# Patient Record
Sex: Female | Born: 1955
Health system: Southern US, Community
[De-identification: ages and names within clinical notes are randomized; demographics above are authoritative.]

## PROBLEM LIST (undated history)

## (undated) DIAGNOSIS — Z87442 Personal history of urinary calculi: Secondary | ICD-10-CM

## (undated) DIAGNOSIS — K219 Gastro-esophageal reflux disease without esophagitis: Secondary | ICD-10-CM

## (undated) DIAGNOSIS — M1990 Unspecified osteoarthritis, unspecified site: Secondary | ICD-10-CM

## (undated) DIAGNOSIS — E785 Hyperlipidemia, unspecified: Secondary | ICD-10-CM

## (undated) DIAGNOSIS — I10 Essential (primary) hypertension: Secondary | ICD-10-CM

## (undated) DIAGNOSIS — T7840XA Allergy, unspecified, initial encounter: Secondary | ICD-10-CM

## (undated) DIAGNOSIS — E039 Hypothyroidism, unspecified: Secondary | ICD-10-CM

## (undated) HISTORY — PX: OTHER SURGICAL HISTORY: SHX169

## (undated) HISTORY — DX: Allergy, unspecified, initial encounter: T78.40XA

## (undated) HISTORY — PX: TONSILLECTOMY: SUR1361

## (undated) HISTORY — DX: Essential (primary) hypertension: I10

## (undated) HISTORY — PX: TONSILLECTOMY AND ADENOIDECTOMY: SHX28

## (undated) HISTORY — DX: Gastro-esophageal reflux disease without esophagitis: K21.9

## (undated) HISTORY — PX: BUNIONECTOMY: SHX129

## (undated) HISTORY — PX: DILATION AND CURETTAGE OF UTERUS: SHX78

## (undated) HISTORY — DX: Hyperlipidemia, unspecified: E78.5

---

## 1997-10-12 ENCOUNTER — Ambulatory Visit (HOSPITAL_COMMUNITY): Admission: RE | Admit: 1997-10-12 | Discharge: 1997-10-12 | Payer: Self-pay | Admitting: *Deleted

## 1998-02-05 ENCOUNTER — Other Ambulatory Visit: Admission: RE | Admit: 1998-02-05 | Discharge: 1998-02-05 | Payer: Self-pay | Admitting: *Deleted

## 1998-06-19 HISTORY — PX: ABDOMINAL HYSTERECTOMY: SHX81

## 1998-09-16 ENCOUNTER — Inpatient Hospital Stay (HOSPITAL_COMMUNITY): Admission: RE | Admit: 1998-09-16 | Discharge: 1998-09-18 | Payer: Self-pay | Admitting: *Deleted

## 1998-10-12 ENCOUNTER — Encounter: Payer: Self-pay | Admitting: *Deleted

## 1998-10-12 ENCOUNTER — Ambulatory Visit (HOSPITAL_COMMUNITY): Admission: RE | Admit: 1998-10-12 | Discharge: 1998-10-12 | Payer: Self-pay | Admitting: *Deleted

## 1999-09-28 ENCOUNTER — Other Ambulatory Visit: Admission: RE | Admit: 1999-09-28 | Discharge: 1999-09-28 | Payer: Self-pay | Admitting: *Deleted

## 1999-10-13 ENCOUNTER — Encounter: Payer: Self-pay | Admitting: *Deleted

## 1999-10-13 ENCOUNTER — Ambulatory Visit (HOSPITAL_COMMUNITY): Admission: RE | Admit: 1999-10-13 | Discharge: 1999-10-13 | Payer: Self-pay | Admitting: *Deleted

## 2000-10-03 ENCOUNTER — Other Ambulatory Visit: Admission: RE | Admit: 2000-10-03 | Discharge: 2000-10-03 | Payer: Self-pay | Admitting: *Deleted

## 2000-10-19 ENCOUNTER — Ambulatory Visit (HOSPITAL_COMMUNITY): Admission: RE | Admit: 2000-10-19 | Discharge: 2000-10-19 | Payer: Self-pay | Admitting: *Deleted

## 2000-10-19 ENCOUNTER — Encounter: Payer: Self-pay | Admitting: *Deleted

## 2000-11-15 ENCOUNTER — Ambulatory Visit (HOSPITAL_COMMUNITY): Admission: RE | Admit: 2000-11-15 | Discharge: 2000-11-15 | Payer: Self-pay | Admitting: Gastroenterology

## 2000-11-15 ENCOUNTER — Encounter: Payer: Self-pay | Admitting: Gastroenterology

## 2001-10-01 ENCOUNTER — Encounter: Payer: Self-pay | Admitting: Family Medicine

## 2001-10-01 ENCOUNTER — Encounter: Admission: RE | Admit: 2001-10-01 | Discharge: 2001-10-01 | Payer: Self-pay | Admitting: Family Medicine

## 2001-10-03 ENCOUNTER — Other Ambulatory Visit: Admission: RE | Admit: 2001-10-03 | Discharge: 2001-10-03 | Payer: Self-pay | Admitting: *Deleted

## 2001-11-12 ENCOUNTER — Ambulatory Visit (HOSPITAL_COMMUNITY): Admission: RE | Admit: 2001-11-12 | Discharge: 2001-11-12 | Payer: Self-pay | Admitting: *Deleted

## 2001-11-12 ENCOUNTER — Encounter: Payer: Self-pay | Admitting: *Deleted

## 2002-03-25 ENCOUNTER — Ambulatory Visit (HOSPITAL_COMMUNITY): Admission: RE | Admit: 2002-03-25 | Discharge: 2002-03-25 | Payer: Self-pay | Admitting: Gastroenterology

## 2002-03-25 ENCOUNTER — Encounter: Payer: Self-pay | Admitting: Gastroenterology

## 2002-11-14 ENCOUNTER — Ambulatory Visit (HOSPITAL_COMMUNITY): Admission: RE | Admit: 2002-11-14 | Discharge: 2002-11-14 | Payer: Self-pay | Admitting: *Deleted

## 2002-11-14 ENCOUNTER — Encounter: Payer: Self-pay | Admitting: *Deleted

## 2002-11-18 ENCOUNTER — Other Ambulatory Visit: Admission: RE | Admit: 2002-11-18 | Discharge: 2002-11-18 | Payer: Self-pay | Admitting: *Deleted

## 2003-11-17 ENCOUNTER — Ambulatory Visit (HOSPITAL_COMMUNITY): Admission: RE | Admit: 2003-11-17 | Discharge: 2003-11-17 | Payer: Self-pay | Admitting: *Deleted

## 2003-11-18 ENCOUNTER — Other Ambulatory Visit: Admission: RE | Admit: 2003-11-18 | Discharge: 2003-11-18 | Payer: Self-pay | Admitting: *Deleted

## 2004-10-17 ENCOUNTER — Encounter: Admission: RE | Admit: 2004-10-17 | Discharge: 2004-10-17 | Payer: Self-pay | Admitting: Gastroenterology

## 2004-11-17 ENCOUNTER — Ambulatory Visit (HOSPITAL_COMMUNITY): Admission: RE | Admit: 2004-11-17 | Discharge: 2004-11-17 | Payer: Self-pay | Admitting: *Deleted

## 2004-11-29 ENCOUNTER — Encounter: Admission: RE | Admit: 2004-11-29 | Discharge: 2004-11-29 | Payer: Self-pay | Admitting: *Deleted

## 2004-12-01 ENCOUNTER — Other Ambulatory Visit: Admission: RE | Admit: 2004-12-01 | Discharge: 2004-12-01 | Payer: Self-pay | Admitting: *Deleted

## 2005-06-07 ENCOUNTER — Encounter: Admission: RE | Admit: 2005-06-07 | Discharge: 2005-06-07 | Payer: Self-pay | Admitting: Gastroenterology

## 2005-06-19 HISTORY — PX: CHOLECYSTECTOMY: SHX55

## 2005-07-20 ENCOUNTER — Ambulatory Visit (HOSPITAL_COMMUNITY): Admission: RE | Admit: 2005-07-20 | Discharge: 2005-07-20 | Payer: Self-pay | Admitting: Surgery

## 2005-07-20 ENCOUNTER — Encounter (INDEPENDENT_AMBULATORY_CARE_PROVIDER_SITE_OTHER): Payer: Self-pay | Admitting: Specialist

## 2005-11-20 ENCOUNTER — Ambulatory Visit (HOSPITAL_COMMUNITY): Admission: RE | Admit: 2005-11-20 | Discharge: 2005-11-20 | Payer: Self-pay | Admitting: *Deleted

## 2005-12-27 ENCOUNTER — Other Ambulatory Visit: Admission: RE | Admit: 2005-12-27 | Discharge: 2005-12-27 | Payer: Self-pay | Admitting: *Deleted

## 2006-03-06 ENCOUNTER — Encounter: Admission: RE | Admit: 2006-03-06 | Discharge: 2006-03-06 | Payer: Self-pay | Admitting: Gastroenterology

## 2006-10-12 ENCOUNTER — Ambulatory Visit (HOSPITAL_COMMUNITY): Admission: RE | Admit: 2006-10-12 | Discharge: 2006-10-12 | Payer: Self-pay | Admitting: Gastroenterology

## 2006-11-23 ENCOUNTER — Encounter: Admission: RE | Admit: 2006-11-23 | Discharge: 2006-11-23 | Payer: Self-pay | Admitting: *Deleted

## 2007-01-08 ENCOUNTER — Encounter: Admission: RE | Admit: 2007-01-08 | Discharge: 2007-01-08 | Payer: Self-pay | Admitting: Family Medicine

## 2007-01-16 ENCOUNTER — Other Ambulatory Visit: Admission: RE | Admit: 2007-01-16 | Discharge: 2007-01-16 | Payer: Self-pay | Admitting: *Deleted

## 2007-10-29 ENCOUNTER — Ambulatory Visit (HOSPITAL_BASED_OUTPATIENT_CLINIC_OR_DEPARTMENT_OTHER): Admission: RE | Admit: 2007-10-29 | Discharge: 2007-10-29 | Payer: Self-pay | Admitting: Otolaryngology

## 2007-11-12 ENCOUNTER — Ambulatory Visit: Payer: Self-pay | Admitting: Internal Medicine

## 2007-11-26 ENCOUNTER — Ambulatory Visit (HOSPITAL_COMMUNITY): Admission: RE | Admit: 2007-11-26 | Discharge: 2007-11-26 | Payer: Self-pay | Admitting: Obstetrics and Gynecology

## 2008-10-16 ENCOUNTER — Encounter: Admission: RE | Admit: 2008-10-16 | Discharge: 2008-10-16 | Payer: Self-pay | Admitting: Otolaryngology

## 2008-11-27 ENCOUNTER — Ambulatory Visit (HOSPITAL_COMMUNITY): Admission: RE | Admit: 2008-11-27 | Discharge: 2008-11-27 | Payer: Self-pay | Admitting: Obstetrics and Gynecology

## 2009-06-15 ENCOUNTER — Ambulatory Visit (HOSPITAL_BASED_OUTPATIENT_CLINIC_OR_DEPARTMENT_OTHER): Admission: RE | Admit: 2009-06-15 | Discharge: 2009-06-15 | Payer: Self-pay | Admitting: Otolaryngology

## 2009-12-08 ENCOUNTER — Encounter: Admission: RE | Admit: 2009-12-08 | Discharge: 2009-12-08 | Payer: Self-pay | Admitting: Obstetrics and Gynecology

## 2010-07-10 ENCOUNTER — Encounter: Payer: Self-pay | Admitting: Obstetrics and Gynecology

## 2010-07-10 ENCOUNTER — Encounter: Payer: Self-pay | Admitting: *Deleted

## 2010-07-10 ENCOUNTER — Encounter: Payer: Self-pay | Admitting: Gastroenterology

## 2010-09-19 LAB — POCT HEMOGLOBIN-HEMACUE: Hemoglobin: 13.5 g/dL (ref 12.0–15.0)

## 2010-09-20 ENCOUNTER — Other Ambulatory Visit: Payer: Self-pay | Admitting: Gynecology

## 2010-09-20 DIAGNOSIS — Z1231 Encounter for screening mammogram for malignant neoplasm of breast: Secondary | ICD-10-CM

## 2010-11-01 NOTE — Procedures (Signed)
NAME:  Nancy Lee, Nancy Lee               ACCOUNT NO.:  [id number]   MEDICAL RECORD NO.:  [id number]          PATIENT TYPE:  OUT   LOCATION:  SLEEP CENTER                 FACILITY:  [facility]   PHYSICIAN:  Clinton D. Maple Hudson, MD, FCCP, FACPDATE OF BIRTH:  04-26-1956   DATE OF STUDY:  10/29/2007                            NOCTURNAL POLYSOMNOGRAM   REFERRING PHYSICIAN:  Antony Contras, MD   INDICATIONS FOR PROCEDURE:  Insomnia with sleep apnea.   RESULTS:  Epward sleepiness score 0/24; BMI 26, weight 171 pounds,  height 68 inches, neck 14.5 inches.   HOME MEDICATIONS:  Charted and reviewed.   SLEEP ARCHITECTURE:  Total sleep time 354 minutes with sleep efficiency  86.9%. Stage 1 was 8.9%; Stage 2, 68.3%; Stage 3 absent. REM 22.8% of  total sleep time. Sleep latency 36 minutes, REM latency 87 minutes.  Awake after sleep onset 18.5 minutes. Arousal index 10.2. No bedtime  medication was taken.   RESPIRATORY DATA:  Apnea/hypopnea index (AHI) 3 per hour, which is  normal. 18 events were scored including 5 obstructive apnea's and 13  hypopnea's. Events were not positional. REM AHI 11.9. There were  insufficient events to permit CPAP titration by split protocol on this  study night.   OXYGEN DATA:  Moderate snoring with oxygen desaturation to a nadir of  84%. Mean oxygen saturation through the study was 97.5% on room air.   CARDIAC DATA:  Normal sinus rhythm.   MOVEMENT/PARASOMNIA:  No movement disturbance. No bathroom trips.   IMPRESSION/RECOMMENDATIONS:  1. Unremarkable sleep architecture for sleep center environment.  2. Occasional respiratory events, AHI 3 per hour (normal range 0 to 5      per hour) with moderate snoring and oxygen desaturation to a nadir      of 84%.  3. No specific intervention is suggested by the results of this test.     Clinton D. Maple Hudson, MD, [facility], FACP  Diplomate, Biomedical engineer of Sleep Medicine  Electronically Signed    CDY/MEDQ  D:  11/09/2007 19:50:27  T:   11/09/2007 22:04:01  Job:  [id number]

## 2010-11-04 NOTE — Op Note (Signed)
NAME:  Nancy Lee, Nancy Lee                         ACCOUNT NO.:  [id number]   MEDICAL RECORD NO.:  [id number]                   PATIENT TYPE:  AMB   LOCATION:  ENDO                                 FACILITY:  [facility]   PHYSICIAN:  Petra Kuba, M.D.                 DATE OF BIRTH:  Oct 22, 1955   DATE OF PROCEDURE:  DATE OF DISCHARGE:                                 OPERATIVE REPORT   PROCEDURE:  Esophagogastroduodenoscopy with Savary dilatation.   INDICATIONS FOR PROCEDURE:  A patient with a history of dysphagia helped  with dilatation in the past requesting repeat more aggressive dilatation.   Consent was signed after risks, benefits, methods, and options were  thoroughly discussed multiple times in the past.   MEDICINES USED:  Demerol 60, Versed 7.   DESCRIPTION OF PROCEDURE:  The video endoscope was inserted by direct  vision. The esophagus was normal. In the distal esophagus possibly a small  thin ring was seen. The scope passed easily through this into a normal  stomach, advanced through a normal antrum, normal pylorus into a normal  duodenal bulb and around the C loop to a normal second portion of the  duodenum. The scope was withdrawn back to the bulb and a good look there  ruled out ulcers in that location. The scope was withdrawn back to the  stomach and retroflexed. The angularis, cardia, fundus, lesser and greater  and curve were normal on retroflexed visualization. Straight visualization  of the stomach was normal. The scope was then withdrawn back to 20 cm. No  additional esophageal findings were seen. The scope was then readvanced to  the antrum, the Savary wire was advanced, scope was removed, proper position  of the wire was confirmed under Fluoro. In succession, the Savary 16 and  then 17 mm dilators were both advanced under Fluoro guidance into the  stomach. There was minimal resistance on both dilators, minimal heme on the  17 only but not on the 16. Once the 17 was  withdrawn, it was advanced into  the stomach, the wire was withdrawn back into the dilator and both were  removed in tandem. The procedure terminated at that juncture. The patient  tolerated the procedure well. There was on obvious or immediate  complication.   ENDOSCOPIC DIAGNOSIS:  1. Questionable distal esophageal thin ring.  2. Otherwise within normal limits esophagogastroduodenoscopy therapy, Savary     dilatation under Fluoro to 17 mm.   PLAN:  Continue pump inhibitors, follow-up p.r.n. or in two months to  recheck symptoms and make sure repeat barium swallow, manometry or other  workup and plans are not needed. Have her call me sooner p.r.n.  Petra Kuba, M.D.    MEM/MEDQ  D:  03/25/2002  T:  03/25/2002  Job:  [id number]   cc:   Gretta Arab. Valentina Lucks, M.D.

## 2010-11-04 NOTE — Op Note (Signed)
Nancy Lee, Nancy Lee               ACCOUNT NO.:  [id number]   MEDICAL RECORD NO.:  [id number]          PATIENT TYPE:  AMB   LOCATION:  ENDO                         FACILITY:  [facility]   PHYSICIAN:  Petra Kuba, M.D.    DATE OF BIRTH:  [birth year]   DATE OF PROCEDURE:  10/12/2006  DATE OF DISCHARGE:                               OPERATIVE REPORT   SURGEON:  Petra Kuba, M.D.   PROCEDURE:  Esophagogastroduodenoscopy with Savary dilation.   INDICATIONS:  Patient with history of recurrent dysphagia, helped with  dilation in the past.  Consent was signed after risks, benefits, methods  and options were thoroughly discussed multiple times in the past.   MEDICINES USED:  Fentanyl 75 mcg, Versed 7.5 mg.   DESCRIPTION OF PROCEDURE:  The video endoscope was inserted by direct  vision.  The esophagus was normal.  Maybe she had a tiny hiatal hernia.  No obvious ring or stricture were seen.  The scope passed into the  stomach and advanced through a normal antrum, normal pylorus into a  normal duodenal bulb, and around the C loop to a normal second portion  of the duodenum.  The scope was slowly withdrawn back to the bulb.  A  good look there ruled out abnormalities in that location.  The scope was  withdrawn back to the stomach and retroflexed.  The cardia, fundus,  angularis, lesser and greater curve were normal on retroflex  visualization.  Straight visualization of the stomach did not reveal any  additional findings.  The air was suctioned and the scope slowly  withdrawn.  Again, a good look at the esophagus was normal.  The scope  was withdrawn back to about 18 cm.  We then went ahead and readvanced to  the antrum.  The customary J loop of the scope was confirmed under  fluoro.  The Savary wire was advanced.  The J loop of the wire was  confirmed.  Once the wire was in the proper position in the stomach  under fluoro and endoscopic visualization, the scope was removed, making  sure to  keep the wire in the proper position, and we went ahead and  dilated her just once with the 16-mm dilator over the wire under fluoro  in the customary fashion.  There was minimal proximal resistance only.  Once the dilator was confirmed in the stomach, the wire was withdrawn  back into the dilator and both were removed in tandem.  The procedure  was terminated a this juncture.  The patient tolerated the procedure  well.  There was no obvious immediate complication.   ENDOSCOPIC DIAGNOSES:  1. Questionable tiny hiatal hernia.  2. Otherwise normal esophagogastroduodenoscopy.   THERAPY:  Savary dilation under fluoro to 16 mm only, with minimal  proximal resistance and no heme.   PLAN:  She wants to try Aciphex as she had not tried that, and we will  give her some samples in the office.  We will see how the dilation  works.  Call me p.r.n., and otherwise follow up in 2 to 3 months to  recheck symptoms and making sure no further workup plans are needed.           ______________________________  Petra Kuba, M.D.     MEM/MEDQ  D:  10/12/2006  T:  10/12/2006  Job:  [id number]   cc:   Gretta Arab. Valentina Lucks, M.D.

## 2010-11-04 NOTE — Op Note (Signed)
NAME:  Nancy Lee, Nancy Lee               ACCOUNT NO.:  [id number]   MEDICAL RECORD NO.:  [id number]          PATIENT TYPE:  AMB   LOCATION:  DAY                          [facility]ACILITY:  [facility]   PHYSICIAN:  Thomas A. Cornett, M.D.DATE OF BIRTH:  1956/01/30   DATE OF PROCEDURE:  07/20/2005  DATE OF DISCHARGE:                                 OPERATIVE REPORT   PREOPERATIVE DIAGNOSIS:  Symptomatic cholelithiasis.   POSTOPERATIVE DIAGNOSIS:  Symptomatic cholelithiasis.   PROCEDURE:  Laparoscopic cholecystectomy with intraoperative cholangiogram.   SURGEON:  Dr. Harriette Bouillon.   ASSISTANT:  Dr. Ollen Gross. Carolynne Edouard.   ANESTHESIA:  General endotracheal anesthesia with 20 mL of 0.25% Sensorcaine  local.   ESTIMATED BLOOD LOSS:  30 mL.   DRAINS:  None.   SPECIMEN:  Gallbladder with gallstones to pathology.   INDICATIONS FOR PROCEDURE:  The patient is a 55 year old female with  progressive right upper quadrant pain with meals. This has become quite  severe and workup revealed gallstones on workup. Her symptoms were  consistent with symptomatic biliary colic as well as her ultrasound findings  of gallstones. After discussion of treatment options, I recommended  laparoscopic cholecystectomy for symptomatic cholelithiasis. Informed  consent was obtained, the patient agreed to proceed.   DESCRIPTION OF PROCEDURE:  The patient was brought to the operating room,  placed supine. After induction of general endotracheal anesthesia, her  abdomen was prepped and draped in a sterile fashion. An oral gastric tube  was placed and the patient received preoperative antibiotics. A 1 cm  supraumbilical incision was made, dissection was carried to her fascia and  her fascia was grasped with a Kocher clamp. An incision was made in the  fascia and a second Kocher clamp was used to hold the fascia open. I  extended the fascial incision and placed a Kelly clamp through the  preperitoneal space on opening the peritoneal  cavity. I placed my finger and  swept around and felt no intra-abdominal adhesions. A pursestring suture of  #0 Vicryl was placed and a 12-mm Hassan cannula was placed under direct  vision. Pneumoperitoneum was created to 15 mmHg with CO2 and laparoscopy was  performed. There was no evidence of any solid or hollow organ injury with  insertion of the Hasson. Next a 5-mm subxiphoid port was placed under direct  vision. Two other 5-mm ports were placed in the right mid abdomen, both  under direct vision with local anesthesia. There were some right upper  quadrant adhesions what appeared to be a tract from the right lobe of the  liver to the abdominal wall consistent with a previous percutaneous drain of  some sort. We took these adhesions down to the right lobe of the liver as  well as some omental adhesions in the right upper quadrant to facilitate  retraction of the gallbladder over the liver edge. Next the dome was grasped  and retracted toward the patient's right shoulder. There were some omental  adhesions that had pulled away to expose the body and infundibulum of the  gallbladder. Once we did this, we were able to grasp  the infundibulum. I had  to mobilize the gallbladder from its peritoneal attachments to facilitate  more range of motion of the infundibulum. There was a small capsule injury  where the gallbladder was retracted from the right lobe of the liver. At  this point, I was able to clear off the cystic duct and dissect  circumferentially around the cystic duct. This was the only tubular  structure entering the gallbladder. A clip was placed on the gallbladder  side of this and a small incision was made in the cystic duct. A Cook  cholangiogram catheter was introduced and held in place by a clip.  Intraoperative cholangiogram using fluoroscopy and one-half strength Hypaque  dye was used. There is a very long cystic duct draining into common bile  duct. There was a very long  cystic duct draining into a common bile duct.  There was flow of contrast up into the common hepatic duct and right and  left hepatic ducts and free flow of contrast down the common bile duct into  the duodenum without stricture or filling defect of note. Once this was  complete, the cholangiogram catheter was removed and the cystic duct stump  was triple clipped and divided. At this point, the cystic artery was  dissected out circumferentially and it was double clipped and divided.  Cautery was used to dissect the gallbladder from the gallbladder fossa.  There were no other tubular structures encountered at this point. Once the  gallbladder was dissected out circumferentially from the gallbladder fossa,  I exchanged the 10 mm camera for the 5 mm camera and placed the EndoCatch  bag through the umbilical port to extract the gallbladder and did so. I then  replaced the 10 mm scope at the umbilicus and reexamined the gallbladder  bed. There was some oozing which was controlled by electrocautery.  Hemostasis at this point in time after irrigation was excellent. All  remaining fluid was suctioned out until clear. There was one small hole made  in the gallbladder during the extraction process prior to placing it in a  bag but no stones were spilled. At this point in time, I extracted all the  ports under direct vision with no signs of port site bleeding. I then  withdrew the camera, withdrew the [facility] port and released all CO2. The  umbilical fascia was closed with the previously placed #0 Vicryl suture and  4-0 Monocryl was used to close all skin incisions. Steri-Strips and dry  dressings were applied. All final counts of sponge, needle and instruments  were found to be correct. The patient was awoke and taken to recovery in  satisfactory condition.      Thomas A. Cornett, M.D.  Electronically Signed    TAC/MEDQ  D:  07/20/2005  T:  07/20/2005  Job:  [id number]   cc:   Petra Kuba,  M.D.  Fax: [phone]

## 2010-12-16 ENCOUNTER — Other Ambulatory Visit: Payer: Self-pay | Admitting: Gynecology

## 2010-12-16 ENCOUNTER — Ambulatory Visit
Admission: RE | Admit: 2010-12-16 | Discharge: 2010-12-16 | Disposition: A | Discharge status: Home / Self Care | Payer: 59 | Source: Ambulatory Visit | Attending: Gynecology | Admitting: Gynecology

## 2010-12-16 DIAGNOSIS — Z1231 Encounter for screening mammogram for malignant neoplasm of breast: Secondary | ICD-10-CM

## 2011-01-16 ENCOUNTER — Other Ambulatory Visit: Payer: Self-pay | Admitting: Family Medicine

## 2011-01-16 DIAGNOSIS — E049 Nontoxic goiter, unspecified: Secondary | ICD-10-CM

## 2011-01-20 ENCOUNTER — Ambulatory Visit
Admission: RE | Admit: 2011-01-20 | Discharge: 2011-01-20 | Disposition: A | Discharge status: Home / Self Care | Payer: 59 | Source: Ambulatory Visit | Attending: Family Medicine | Admitting: Family Medicine

## 2011-01-20 DIAGNOSIS — E049 Nontoxic goiter, unspecified: Secondary | ICD-10-CM

## 2011-01-23 ENCOUNTER — Other Ambulatory Visit: Payer: Self-pay | Admitting: Family Medicine

## 2011-01-23 DIAGNOSIS — E042 Nontoxic multinodular goiter: Secondary | ICD-10-CM

## 2011-01-25 ENCOUNTER — Other Ambulatory Visit (HOSPITAL_COMMUNITY)
Admission: RE | Admit: 2011-01-25 | Discharge: 2011-01-25 | Disposition: A | Discharge status: Home / Self Care | Payer: 59 | Source: Ambulatory Visit | Attending: Interventional Radiology | Admitting: Interventional Radiology

## 2011-01-25 ENCOUNTER — Ambulatory Visit
Admission: RE | Admit: 2011-01-25 | Discharge: 2011-01-25 | Disposition: A | Discharge status: Home / Self Care | Payer: 59 | Source: Ambulatory Visit | Attending: Family Medicine | Admitting: Family Medicine

## 2011-01-25 DIAGNOSIS — E042 Nontoxic multinodular goiter: Secondary | ICD-10-CM

## 2011-01-25 DIAGNOSIS — E049 Nontoxic goiter, unspecified: Secondary | ICD-10-CM | POA: Insufficient documentation

## 2011-02-22 ENCOUNTER — Encounter (INDEPENDENT_AMBULATORY_CARE_PROVIDER_SITE_OTHER): Payer: Self-pay | Admitting: General Surgery

## 2011-02-23 ENCOUNTER — Ambulatory Visit (INDEPENDENT_AMBULATORY_CARE_PROVIDER_SITE_OTHER): Payer: 59 | Admitting: Surgery

## 2011-02-23 ENCOUNTER — Encounter (INDEPENDENT_AMBULATORY_CARE_PROVIDER_SITE_OTHER): Payer: Self-pay | Admitting: Surgery

## 2011-02-23 VITALS — BP 144/90 | HR 82 | Temp 98.2°F | Resp 12

## 2011-02-23 DIAGNOSIS — E042 Nontoxic multinodular goiter: Secondary | ICD-10-CM

## 2011-02-23 NOTE — Patient Instructions (Signed)
You will be scheduled for surgery.

## 2011-02-23 NOTE — Progress Notes (Signed)
Chief Complaint  Patient presents with  . Thyroid Nodule    HPI Nancy Lee is a 55 y.o. female.   HPI The patient presents at the request of Dr. Maurice Small due to multiple thyroid nodules. This is picked up on physical exam. She is to 3 cm nodules involving her left lobe and multiple 1 cm nodule of her right lobe of the thyroid. FNA was done of the 2 left thyroid lobe lesions. These came back as follicular lesions and oncocytic lesions.  She denies any pain in her neck, hoarseness, difficulty swallowing or difficulty breathing.  Past Medical History  Diagnosis Date  . Allergy   . GERD (gastroesophageal reflux disease)   . Hyperlipidemia   . Hypertension     Past Surgical History  Procedure Date  . Abdominal hysterectomy   . Cholecystectomy   . Bunionectomy   . Dilation and curette   . Tonsillectomy and adenoidectomy     Family History  Problem Relation Age of Onset  . Hypertension Mother   . Alcohol abuse Father   . Coronary artery disease      Social History History  Substance Use Topics  . Smoking status: Former Games developer  . Smokeless tobacco: Not on file  . Alcohol Use: 1.2 oz/week    2 Glasses of wine per week    Allergies  Allergen Reactions  . Penicillins Rash    Current Outpatient Prescriptions  Medication Sig Dispense Refill  . aspirin 81 MG tablet Take 81 mg by mouth daily.        . Calcium Carbonate (CALCIUM 600) 1500 MG TABS Take 1 tablet by mouth 1 day or 1 dose.        . calcium citrate-vitamin D (CITRACAL+D) 315-200 MG-UNIT per tablet Take 1 tablet by mouth daily.        . cholecalciferol (VITAMIN D) 1000 UNITS tablet Take 1,000 Units by mouth daily.        Marland Kitchen Cod Liver Oil CAPS Take by mouth 1 day or 1 dose.        . fexofenadine (ALLEGRA) 180 MG tablet Take 180 mg by mouth daily.        . Flaxseed, Linseed, (FLAX SEED OIL) 1000 MG CAPS Take 1,000 mg by mouth 1 day or 1 dose.        . fluticasone (FLONASE) 50 MCG/ACT nasal spray Place 1  spray into the nose 2 (two) times daily. 1 PUFF IN EACH NOSTRIL       . Multiple Vitamin (MULTIVITAMIN) capsule Take 1 capsule by mouth daily.        Marland Kitchen OMEPRAZOLE PO Take 40 mg by mouth 1 day or 1 dose. DELAYED RELEASE          Review of Systems Review of Systems  Constitutional: Negative.   HENT: Negative.   Eyes: Negative.   Respiratory: Negative.   Cardiovascular: Negative.   Gastrointestinal: Negative.   Genitourinary: Negative.   Musculoskeletal: Negative.   Neurological: Negative.   Hematological: Negative.   Psychiatric/Behavioral: Negative.     Blood pressure 144/90, pulse 82, temperature 98.2 F (36.8 C), resp. rate 12.  Physical Exam Physical Exam  Constitutional: She is oriented to person, place, and time. She appears well-developed and well-nourished.  HENT:  Head: Normocephalic and atraumatic.  Nose: Nose normal.  Eyes: Conjunctivae and EOM are normal. Pupils are equal, round, and reactive to light.  Neck: Normal range of motion. Neck supple. No tracheal deviation present. Thyromegaly present.  Thyroid goiter.  Two 3 cm left thyroid lobe lesions and  One 1 cm thyroid lobe lesion noted.   Cardiovascular: Normal rate, regular rhythm and normal heart sounds.   Pulmonary/Chest: Effort normal and breath sounds normal.  Lymphadenopathy:    She has no cervical adenopathy.  Neurological: She is alert and oriented to person, place, and time.  Skin: Skin is warm and dry.  Psychiatric: She has a normal mood and affect. Her behavior is normal. Judgment and thought content normal.    Data Reviewed Thyroid ultrasound reveals 2 dominant left thyroid lobe lesions measuring 3 cm each in maximal diameter. She has a 1 centimeter lesion in the right thyroid lobe.  Biopsy of left dominant nodules shows a follicular lesion and oncocytic lesion. No atypia  Assessment    Multiple bilateral thyroid nodules 2 of which are 3 cm follicular lesions without atypia.    Plan      I discussed observation versus surgical treatment of this problem. Given the size of her thyroid gland and  multiple  nodules she will probably  require total thyroidectomy. I've also discussed observation with close followup with ultrasound versus hemithyroidectomy for the 2 left sided lesions only. She will pursue surgical therapy. She would like to proceed with total thyroidectomy. Risks of bleeding, infection, low calcium, airway obstruction,  nerve damage to vocal cords,  damage to other structures discussed.  She will need lifelong thyroid replacement.  She agrees to proceed.       CORNETT,THOMAS A. 02/23/2011, 4:01 PM

## 2011-04-24 ENCOUNTER — Other Ambulatory Visit (HOSPITAL_COMMUNITY): Payer: 59

## 2011-04-25 ENCOUNTER — Encounter (HOSPITAL_COMMUNITY): Payer: Self-pay | Admitting: Pharmacy Technician

## 2011-04-26 ENCOUNTER — Encounter (HOSPITAL_COMMUNITY)
Admission: RE | Admit: 2011-04-26 | Discharge: 2011-04-26 | Disposition: A | Discharge status: Home / Self Care | Payer: 59 | Source: Ambulatory Visit | Attending: Surgery | Admitting: Surgery

## 2011-04-26 ENCOUNTER — Encounter (HOSPITAL_COMMUNITY): Payer: Self-pay

## 2011-04-26 ENCOUNTER — Other Ambulatory Visit: Payer: Self-pay

## 2011-04-26 LAB — CBC
HCT: 38.5 % (ref 36.0–46.0)
Hemoglobin: 13.2 g/dL (ref 12.0–15.0)
MCH: 30.3 pg (ref 26.0–34.0)
MCHC: 34.3 g/dL (ref 30.0–36.0)
MCV: 88.5 fL (ref 78.0–100.0)
Platelets: 195 10*3/uL (ref 150–400)
RBC: 4.35 MIL/uL (ref 3.87–5.11)
RDW: 12.6 % (ref 11.5–15.5)
WBC: 7 10*3/uL (ref 4.0–10.5)

## 2011-04-26 LAB — COMPREHENSIVE METABOLIC PANEL
ALT: 14 U/L (ref 0–35)
AST: 15 U/L (ref 0–37)
Albumin: 4.2 g/dL (ref 3.5–5.2)
Alkaline Phosphatase: 116 U/L (ref 39–117)
BUN: 17 mg/dL (ref 6–23)
CO2: 29 mEq/L (ref 19–32)
Calcium: 10.1 mg/dL (ref 8.4–10.5)
Chloride: 103 mEq/L (ref 96–112)
Creatinine, Ser: 0.85 mg/dL (ref 0.50–1.10)
GFR calc Af Amer: 88 mL/min — ABNORMAL LOW (ref >90)
GFR calc non Af Amer: 76 mL/min — ABNORMAL LOW (ref >90)
Glucose, Bld: 90 mg/dL (ref 70–99)
Potassium: 3.9 mEq/L (ref 3.5–5.1)
Sodium: 140 mEq/L (ref 135–145)
Total Bilirubin: 0.2 mg/dL — ABNORMAL LOW (ref 0.3–1.2)
Total Protein: 7.6 g/dL (ref 6.0–8.3)

## 2011-04-26 LAB — SURGICAL PCR SCREEN
MRSA, PCR: NEGATIVE
Staphylococcus aureus: NEGATIVE

## 2011-04-26 LAB — DIFFERENTIAL
Basophils Absolute: 0 10*3/uL (ref 0.0–0.1)
Basophils Relative: 0 % (ref 0–1)
Eosinophils Absolute: 0.1 10*3/uL (ref 0.0–0.7)
Eosinophils Relative: 2 % (ref 0–5)
Lymphocytes Relative: 42 % (ref 12–46)
Lymphs Abs: 2.9 10*3/uL (ref 0.7–4.0)
Monocytes Absolute: 0.5 10*3/uL (ref 0.1–1.0)
Monocytes Relative: 8 % (ref 3–12)
Neutro Abs: 3.5 10*3/uL (ref 1.7–7.7)
Neutrophils Relative %: 49 % (ref 43–77)

## 2011-04-26 NOTE — Pre-Procedure Instructions (Signed)
20 Nancy Lee  04/26/2011   Your procedure is scheduled on:  Tuesday, Nov 13  Report to [facility] at 5:30 AM.  Call this number if you have problems the morning of surgery: [phone]   Remember:   Do not eat food:After Midnight.  Do not drink clear liquids: 4 Hours before arrival.  Take these medicines the morning of surgery with A SIP OF WATER: Omeprazole   Do not wear jewelry, make-up or nail polish.  Do not wear lotions, powders, or perfumes. You may wear deodorant.  Do not shave 48 hours prior to surgery.  Do not bring valuables to the hospital.  Contacts, dentures or bridgework may not be worn into surgery.  Leave suitcase in the car. After surgery it may be brought to your room.  For patients admitted to the hospital, checkout time is 11:00 AM the day of discharge.   Patients discharged the day of surgery will not be allowed to drive home.  Name and phone number of your driver: n/a  Special Instructions: CHG Shower Use Special Wash: 1/2 bottle night before surgery and 1/2 bottle morning of surgery.   Please read over the following fact sheets that you were given: Pain Booklet, Coughing and Deep Breathing, MRSA Information and Surgical Site Infection Prevention

## 2011-05-01 ENCOUNTER — Encounter (HOSPITAL_COMMUNITY): Payer: Self-pay | Admitting: Surgery

## 2011-05-01 MED ORDER — CEFAZOLIN SODIUM 1-5 GM-% IV SOLN
1.0000 g | INTRAVENOUS | Status: AC
  Administered 2011-05-02: 1 g via INTRAVENOUS
  Filled 2011-05-01: qty 50

## 2011-05-01 NOTE — H&P (Signed)
LACRESHA FUSILIER   02/23/2011 3:00 PM Office Visit  MRN: [id number]   Description: 55 year old female  Provider: Dortha Schwalbe., MD  Department: [facility]        Diagnoses     Multiple thyroid nodules   - Primary    241.1      Reason for Visit     Thyroid Nodule        Vitals - Last Recorded       BP Pulse Temp Resp    144/90  82  98.2 F (36.8 C)  12       Progress Notes     CORNETT,THOMAS A., MD  02/23/2011  4:16 PM  SignedChief Complaint   Patient presents with   .  Thyroid Nodule      HPI Shannelle Alguire Sawka is a 55 y.o. female.   HPI The patient presents at the request of Dr. Maurice Small due to multiple thyroid nodules. This is picked up on physical exam. She is to 3 cm nodules involving her left lobe and multiple 1 cm nodule of her right lobe of the thyroid. FNA was done of the 2 left thyroid lobe lesions. These came back as follicular lesions and oncocytic lesions.  She denies any pain in her neck, hoarseness, difficulty swallowing or difficulty breathing.    Past Medical History   Diagnosis  Date   .  Allergy     .  GERD (gastroesophageal reflux disease)     .  Hyperlipidemia     .  Hypertension         Past Surgical History   Procedure  Date   .  Abdominal hysterectomy     .  Cholecystectomy     .  Bunionectomy     .  Dilation and curette     .  Tonsillectomy and adenoidectomy         Family History   Problem  Relation  Age of Onset   .  Hypertension  Mother     .  Alcohol abuse  Father     .  Coronary artery disease          Social History History   Substance Use Topics   .  Smoking status:  Former Games developer   .  Smokeless tobacco:  Not on file   .  Alcohol Use:  1.2 oz/week       2 Glasses of wine per week       Allergies   Allergen  Reactions   .  Penicillins  Rash       Current Outpatient Prescriptions   Medication  Sig  Dispense  Refill   .  aspirin 81 MG tablet  Take 81 mg by mouth daily.           .  Calcium Carbonate  (CALCIUM 600) 1500 MG TABS  Take 1 tablet by mouth 1 day or 1 dose.           .  calcium citrate-vitamin D (CITRACAL+D) 315-200 MG-UNIT per tablet  Take 1 tablet by mouth daily.           .  cholecalciferol (VITAMIN D) 1000 UNITS tablet  Take 1,000 Units by mouth daily.           Marland Kitchen  Cod Liver Oil CAPS  Take by mouth 1 day or 1 dose.           .  fexofenadine (ALLEGRA) 180  MG tablet  Take 180 mg by mouth daily.           .  Flaxseed, Linseed, (FLAX SEED OIL) 1000 MG CAPS  Take 1,000 mg by mouth 1 day or 1 dose.           .  fluticasone (FLONASE) 50 MCG/ACT nasal spray  Place 1 spray into the nose 2 (two) times daily. 1 PUFF IN EACH NOSTRIL          .  Multiple Vitamin (MULTIVITAMIN) capsule  Take 1 capsule by mouth daily.           Marland Kitchen  OMEPRAZOLE PO  Take 40 mg by mouth 1 day or 1 dose. DELAYED RELEASE               Review of Systems Review of Systems  Constitutional: Negative.   HENT: Negative.   Eyes: Negative.   Respiratory: Negative.   Cardiovascular: Negative.   Gastrointestinal: Negative.   Genitourinary: Negative.   Musculoskeletal: Negative.   Neurological: Negative.   Hematological: Negative.   Psychiatric/Behavioral: Negative.     Blood pressure 144/90, pulse 82, temperature 98.2 F (36.8 C), resp. rate 12.   Physical Exam Physical Exam  Constitutional: She is oriented to person, place, and time. She appears well-developed and well-nourished.  HENT:   Head: Normocephalic and atraumatic.   Nose: Nose normal.  Eyes: Conjunctivae and EOM are normal. Pupils are equal, round, and reactive to light.  Neck: Normal range of motion. Neck supple. No tracheal deviation present. Thyromegaly present.       Thyroid goiter.  Two 3 cm left thyroid lobe lesions and  One 1 cm thyroid lobe lesion noted.   Cardiovascular: Normal rate, regular rhythm and normal heart sounds.   Pulmonary/Chest: Effort normal and breath sounds normal.  Lymphadenopathy:    She has no cervical adenopathy.    Neurological: She is alert and oriented to person, place, and time.  Skin: Skin is warm and dry.  Psychiatric: She has a normal mood and affect. Her behavior is normal. Judgment and thought content normal.    Data Reviewed Thyroid ultrasound reveals 2 dominant left thyroid lobe lesions measuring 3 cm each in maximal diameter. She has a 1 centimeter lesion in the right thyroid lobe.  Biopsy of left dominant nodules shows a follicular lesion and oncocytic lesion. No atypia   Assessment   Multiple bilateral thyroid nodules 2 of which are 3 cm follicular lesions without atypia.   Plan   I discussed observation versus surgical treatment of this problem. Given the size of her thyroid gland and  multiple  nodules she will probably  require total thyroidectomy. I've also discussed observation with close followup with ultrasound versus hemithyroidectomy for the 2 left sided lesions only. She will pursue surgical therapy. She would like to proceed with total thyroidectomy. Risks of bleeding, infection, low calcium, airway obstruction,  nerve damage to vocal cords,  damage to other structures discussed.  She will need lifelong thyroid replacement.  She agrees to proceed.       CORNETT,THOMAS A. 02/23/2011, 4:01 PM                Not recorded             Patient Instructions     You will be scheduled for surgery.       Level of Service     PR OFFICE CONSULTATION,LEVEL III [id number]      Follow-up and Disposition  Return if symptoms worsen or fail to improve.        All Flowsheet Templates (all recorded)     Encounter Vitals Flowsheet                   Letters     Letter Information         Status    Harriette Bouillon A on 02/23/2011 Sent           Referring Provider     Astrid Divine, MD       All Charges for This Encounter       Code Description Service Date Service Provider Modifiers Quantity    [phone] PR OFFICE CONSULTATION,LEVEL III  02/23/2011 Maisie Fus A. Cornett, MD   1      Routing History       Recipient Method User Date Routed To    Astrid Divine, MD Fax Maisie Fus A. Cornett, MD [[id number]] 02/23/2011 (none on file)    Fax: [phone] Phone: [phone] Letter: created on 02/23/2011 by Maisie Fus A. Cornett, MD              Other Encounter Related Information     Allergies & Medications         Problem List         History         Patient-Entered Questionnaires     No data filed

## 2011-05-01 NOTE — Consult Note (Signed)
Anesthesia:  Patient is a 55 year old female for total thyroidectomy on 05/02/11.  Hx + hyperlipidemia, former smoker, GERD.  Asked to evaluate pre-op EKG, appears stable when compared to prior EKGs in 2010 and 2007.  Plan to proceed if remains asymptomatic.

## 2011-05-02 ENCOUNTER — Encounter (HOSPITAL_COMMUNITY): Payer: Self-pay | Admitting: Vascular Surgery

## 2011-05-02 ENCOUNTER — Encounter (HOSPITAL_COMMUNITY): Payer: Self-pay | Admitting: General Practice

## 2011-05-02 ENCOUNTER — Other Ambulatory Visit (INDEPENDENT_AMBULATORY_CARE_PROVIDER_SITE_OTHER): Payer: Self-pay | Admitting: Surgery

## 2011-05-02 ENCOUNTER — Ambulatory Visit (HOSPITAL_COMMUNITY)
Admission: RE | Admit: 2011-05-02 | Discharge: 2011-05-03 | DRG: 627 | Disposition: A | Discharge status: Home / Self Care | Payer: 59 | Source: Ambulatory Visit | Attending: Surgery | Admitting: Surgery

## 2011-05-02 ENCOUNTER — Ambulatory Visit (HOSPITAL_COMMUNITY): Payer: 59 | Admitting: Vascular Surgery

## 2011-05-02 ENCOUNTER — Encounter (HOSPITAL_COMMUNITY): Payer: Self-pay | Admitting: Surgery

## 2011-05-02 ENCOUNTER — Encounter (HOSPITAL_COMMUNITY)
Admission: RE | Disposition: A | Discharge status: Home / Self Care | Payer: Self-pay | Source: Ambulatory Visit | Attending: Surgery

## 2011-05-02 DIAGNOSIS — Z9889 Other specified postprocedural states: Secondary | ICD-10-CM

## 2011-05-02 DIAGNOSIS — C73 Malignant neoplasm of thyroid gland: Secondary | ICD-10-CM

## 2011-05-02 DIAGNOSIS — Z01818 Encounter for other preprocedural examination: Secondary | ICD-10-CM | POA: Insufficient documentation

## 2011-05-02 DIAGNOSIS — Z01812 Encounter for preprocedural laboratory examination: Secondary | ICD-10-CM | POA: Insufficient documentation

## 2011-05-02 DIAGNOSIS — Z01810 Encounter for preprocedural cardiovascular examination: Secondary | ICD-10-CM | POA: Insufficient documentation

## 2011-05-02 DIAGNOSIS — E041 Nontoxic single thyroid nodule: Secondary | ICD-10-CM | POA: Insufficient documentation

## 2011-05-02 HISTORY — PX: TOTAL THYROIDECTOMY: SHX2547

## 2011-05-02 HISTORY — PX: THYROIDECTOMY: SHX17

## 2011-05-02 LAB — CBC
HCT: 35.7 % — ABNORMAL LOW (ref 36.0–46.0)
Hemoglobin: 11.8 g/dL — ABNORMAL LOW (ref 12.0–15.0)
MCH: 29.6 pg (ref 26.0–34.0)
MCHC: 33.1 g/dL (ref 30.0–36.0)
MCV: 89.5 fL (ref 78.0–100.0)
Platelets: 177 10*3/uL (ref 150–400)
RBC: 3.99 MIL/uL (ref 3.87–5.11)
RDW: 12.9 % (ref 11.5–15.5)
WBC: 10.7 10*3/uL — ABNORMAL HIGH (ref 4.0–10.5)

## 2011-05-02 LAB — BASIC METABOLIC PANEL
BUN: 14 mg/dL (ref 6–23)
CO2: 28 mEq/L (ref 19–32)
Calcium: 9.1 mg/dL (ref 8.4–10.5)
Chloride: 103 mEq/L (ref 96–112)
Creatinine, Ser: 0.63 mg/dL (ref 0.50–1.10)
GFR calc Af Amer: >90 mL/min (ref >90)
GFR calc non Af Amer: >90 mL/min (ref >90)
Glucose, Bld: 136 mg/dL — ABNORMAL HIGH (ref 70–99)
Potassium: 4 mEq/L (ref 3.5–5.1)
Sodium: 140 mEq/L (ref 135–145)

## 2011-05-02 SURGERY — THYROIDECTOMY
Anesthesia: General | Site: Neck | Laterality: Bilateral | Wound class: Clean

## 2011-05-02 MED ORDER — VITAMIN D3 25 MCG (1000 UNIT) PO TABS
4000.0000 [IU] | ORAL_TABLET | ORAL | Status: DC
  Administered 2011-05-02 – 2011-05-03 (×2): 4000 [IU] via ORAL
  Filled 2011-05-02 (×3): qty 4

## 2011-05-02 MED ORDER — CALCIUM CARBONATE 1250 (500 CA) MG PO TABS
1000.0000 mg | ORAL_TABLET | Freq: Every day | ORAL | Status: DC
  Filled 2011-05-02: qty 2

## 2011-05-02 MED ORDER — LIDOCAINE HCL (CARDIAC) 20 MG/ML IV SOLN
INTRAVENOUS | Status: DC | PRN
  Administered 2011-05-02: 60 mg via INTRAVENOUS

## 2011-05-02 MED ORDER — NEOSTIGMINE METHYLSULFATE 1 MG/ML IJ SOLN
INTRAMUSCULAR | Status: DC | PRN
  Administered 2011-05-02: 4 mg via INTRAVENOUS

## 2011-05-02 MED ORDER — MORPHINE SULFATE 2 MG/ML IJ SOLN: 0.0500 mg/kg | INTRAMUSCULAR | Status: DC | PRN

## 2011-05-02 MED ORDER — HYDROMORPHONE HCL PF 1 MG/ML IJ SOLN
0.2500 mg | INTRAMUSCULAR | Status: DC | PRN
  Administered 2011-05-02 (×2): 0.5 mg via INTRAVENOUS

## 2011-05-02 MED ORDER — GLYCOPYRROLATE 0.2 MG/ML IJ SOLN
INTRAMUSCULAR | Status: DC | PRN
  Administered 2011-05-02: .7 mg via INTRAVENOUS

## 2011-05-02 MED ORDER — LACTATED RINGERS IV SOLN
INTRAVENOUS | Status: DC | PRN
  Administered 2011-05-02 (×2): via INTRAVENOUS

## 2011-05-02 MED ORDER — CALCIUM CITRATE-VITAMIN D 315-200 MG-UNIT PO TABS
1.0000 | ORAL_TABLET | Freq: Every day | ORAL | Status: DC
Start: 2011-05-02 — End: 2011-05-02

## 2011-05-02 MED ORDER — CALCIUM CARBONATE 1500 (600 CA) MG PO TABS
2.0000 | ORAL_TABLET | Freq: Every day | ORAL | Status: DC
  Administered 2011-05-02: 3000 mg via ORAL
  Filled 2011-05-02: qty 2

## 2011-05-02 MED ORDER — MIDAZOLAM HCL 5 MG/5ML IJ SOLN
INTRAMUSCULAR | Status: DC | PRN
  Administered 2011-05-02: 2 mg via INTRAVENOUS

## 2011-05-02 MED ORDER — ONDANSETRON HCL 4 MG/2ML IJ SOLN: 4.0000 mg | Freq: Once | INTRAMUSCULAR | Status: DC | PRN

## 2011-05-02 MED ORDER — KCL IN DEXTROSE-NACL 20-5-0.45 MEQ/L-%-% IV SOLN
INTRAVENOUS | Status: DC
  Administered 2011-05-02 – 2011-05-03 (×2): via INTRAVENOUS
  Filled 2011-05-02 (×3): qty 1000

## 2011-05-02 MED ORDER — PROPOFOL 10 MG/ML IV EMUL
INTRAVENOUS | Status: DC | PRN
  Administered 2011-05-02: 200 mg via INTRAVENOUS

## 2011-05-02 MED ORDER — PANTOPRAZOLE SODIUM 40 MG PO TBEC
40.0000 mg | DELAYED_RELEASE_TABLET | Freq: Every day | ORAL | Status: DC
  Administered 2011-05-02: 40 mg via ORAL
  Filled 2011-05-02: qty 1

## 2011-05-02 MED ORDER — HEMOSTATIC AGENTS (NO CHARGE) OPTIME
TOPICAL | Status: DC | PRN
  Administered 2011-05-02: 1 via TOPICAL

## 2011-05-02 MED ORDER — MORPHINE SULFATE 2 MG/ML IJ SOLN
2.0000 mg | INTRAMUSCULAR | Status: DC | PRN
  Administered 2011-05-02 (×2): 2 mg via INTRAVENOUS
  Filled 2011-05-02 (×2): qty 1

## 2011-05-02 MED ORDER — OXYCODONE-ACETAMINOPHEN 5-325 MG PO TABS: 2.0000 | ORAL_TABLET | ORAL | Status: DC | PRN

## 2011-05-02 MED ORDER — ROCURONIUM BROMIDE 100 MG/10ML IV SOLN
INTRAVENOUS | Status: DC | PRN
  Administered 2011-05-02: 50 mg via INTRAVENOUS

## 2011-05-02 MED ORDER — ONDANSETRON HCL 4 MG/2ML IJ SOLN: 4.0000 mg | Freq: Four times a day (QID) | INTRAMUSCULAR | Status: DC | PRN

## 2011-05-02 MED ORDER — SODIUM CHLORIDE 0.9 % IR SOLN
Status: DC | PRN
  Administered 2011-05-02: 1000 mL

## 2011-05-02 MED ORDER — FENTANYL CITRATE 0.05 MG/ML IJ SOLN
INTRAMUSCULAR | Status: DC | PRN
  Administered 2011-05-02 (×2): 50 ug via INTRAVENOUS
  Administered 2011-05-02: 100 ug via INTRAVENOUS
  Administered 2011-05-02 (×4): 50 ug via INTRAVENOUS

## 2011-05-02 MED ORDER — MEPERIDINE HCL 25 MG/ML IJ SOLN: 6.2500 mg | INTRAMUSCULAR | Status: DC | PRN

## 2011-05-02 MED ORDER — ONDANSETRON HCL 4 MG PO TABS: 4.0000 mg | ORAL_TABLET | Freq: Four times a day (QID) | ORAL | Status: DC | PRN

## 2011-05-02 MED ORDER — ONDANSETRON HCL 4 MG/2ML IJ SOLN
INTRAMUSCULAR | Status: DC | PRN
  Administered 2011-05-02: 4 mg via INTRAVENOUS

## 2011-05-02 MED ORDER — CALCIUM CITRATE 950 (200 CA) MG PO TABS
200.0000 mg | ORAL_TABLET | Freq: Every day | ORAL | Status: DC
  Administered 2011-05-02: 200 mg via ORAL
  Filled 2011-05-02 (×3): qty 1

## 2011-05-02 MED ORDER — WHITE PETROLATUM GEL
Status: AC
  Administered 2011-05-02: 12:00:00
  Filled 2011-05-02: qty 5

## 2011-05-02 SURGICAL SUPPLY — 56 items
2-0 UNDYED BRAIDED VICRYL TIES ×1 IMPLANT
ADH SKN CLS APL DERMABOND .7 (GAUZE/BANDAGES/DRESSINGS) ×1
ADH SKN CLS LQ APL DERMABOND (GAUZE/BANDAGES/DRESSINGS) ×1
BLADE SURG 10 STRL SS (BLADE) ×2 IMPLANT
BLADE SURG 15 STRL LF DISP TIS (BLADE) ×1 IMPLANT
BLADE SURG 15 STRL SS (BLADE) ×2
BLADE SURG ROTATE 9660 (MISCELLANEOUS) IMPLANT
CANISTER SUCTION 2500CC (MISCELLANEOUS) ×2 IMPLANT
CHLORAPREP W/TINT 10.5 ML (MISCELLANEOUS) ×2 IMPLANT
CLIP TI MEDIUM 24 (CLIP) ×2 IMPLANT
CLIP TI WIDE RED SMALL 24 (CLIP) ×3 IMPLANT
CLOTH BEACON ORANGE TIMEOUT ST (SAFETY) ×2 IMPLANT
CONT SPEC 4OZ CLIKSEAL STRL BL (MISCELLANEOUS) ×1 IMPLANT
COVER SURGICAL LIGHT HANDLE (MISCELLANEOUS) ×2 IMPLANT
CRADLE DONUT ADULT HEAD (MISCELLANEOUS) ×2 IMPLANT
DERMABOND ADHESIVE PROPEN (GAUZE/BANDAGES/DRESSINGS) ×1
DERMABOND ADVANCED (GAUZE/BANDAGES/DRESSINGS) ×1
DERMABOND ADVANCED .7 DNX12 (GAUZE/BANDAGES/DRESSINGS) ×1 IMPLANT
DERMABOND ADVANCED .7 DNX6 (GAUZE/BANDAGES/DRESSINGS) IMPLANT
DRAPE PED LAPAROTOMY (DRAPES) ×2 IMPLANT
DRAPE UTILITY 15X26 W/TAPE STR (DRAPE) ×4 IMPLANT
ELECT CAUTERY BLADE 6.4 (BLADE) ×2 IMPLANT
ELECT REM PT RETURN 9FT ADLT (ELECTROSURGICAL) ×2
ELECTRODE REM PT RTRN 9FT ADLT (ELECTROSURGICAL) ×1 IMPLANT
GAUZE SPONGE 4X4 16PLY XRAY LF (GAUZE/BANDAGES/DRESSINGS) ×2 IMPLANT
GLOVE BIO SURGEON STRL SZ7.5 (GLOVE) ×1 IMPLANT
GLOVE BIO SURGEON STRL SZ8 (GLOVE) ×3 IMPLANT
GLOVE BIOGEL PI IND STRL 8 (GLOVE) ×1 IMPLANT
GLOVE BIOGEL PI INDICATOR 8 (GLOVE) ×1
GLOVE ECLIPSE 6.5 STRL STRAW (GLOVE) ×1 IMPLANT
GLOVE SURG SS PI 6.5 STRL IVOR (GLOVE) ×3 IMPLANT
GOWN STRL NON-REIN LRG LVL3 (GOWN DISPOSABLE) ×8 IMPLANT
HEMOSTAT SNOW SURGICEL 2X4 (HEMOSTASIS) ×1 IMPLANT
HEMOSTAT SURGICEL 2X14 (HEMOSTASIS) IMPLANT
KIT BASIN OR (CUSTOM PROCEDURE TRAY) ×2 IMPLANT
KIT ROOM TURNOVER OR (KITS) ×2 IMPLANT
NS IRRIG 1000ML POUR BTL (IV SOLUTION) ×2 IMPLANT
PACK SURGICAL SETUP 50X90 (CUSTOM PROCEDURE TRAY) ×2 IMPLANT
PAD ARMBOARD 7.5X6 YLW CONV (MISCELLANEOUS) ×2 IMPLANT
PENCIL BUTTON HOLSTER BLD 10FT (ELECTRODE) ×2 IMPLANT
SHEARS HARMONIC 9CM CVD (BLADE) ×2 IMPLANT
SPECIMEN JAR MEDIUM (MISCELLANEOUS) IMPLANT
SPONGE INTESTINAL PEANUT (DISPOSABLE) ×2 IMPLANT
STAPLER VISISTAT 35W (STAPLE) ×2 IMPLANT
SUT MNCRL AB 4-0 PS2 18 (SUTURE) ×2 IMPLANT
SUT SILK 2 0 (SUTURE) ×2
SUT SILK 2-0 18XBRD TIE 12 (SUTURE) ×1 IMPLANT
SUT VIC AB 2-0 SH 18 (SUTURE) ×1 IMPLANT
SUT VIC AB 3-0 54X BRD REEL (SUTURE) IMPLANT
SUT VIC AB 3-0 BRD 54 (SUTURE) ×2
SUT VIC AB 3-0 SH 18 (SUTURE) ×2 IMPLANT
SYR BULB 3OZ (MISCELLANEOUS) ×2 IMPLANT
TOWEL OR 17X24 6PK STRL BLUE (TOWEL DISPOSABLE) ×2 IMPLANT
TOWEL OR 17X26 10 PK STRL BLUE (TOWEL DISPOSABLE) ×2 IMPLANT
TUBE CONNECTING 12X1/4 (SUCTIONS) ×2 IMPLANT
WATER STERILE IRR 1000ML POUR (IV SOLUTION) IMPLANT

## 2011-05-02 NOTE — Anesthesia Preprocedure Evaluation (Addendum)
Anesthesia Evaluation  Patient identified by MRN, date of birth, ID band Patient awake    Reviewed: Allergy & Precautions, H&P , NPO status , Patient's Chart, lab work & pertinent test results  History of Anesthesia Complications Negative for: history of anesthetic complications  Airway Mallampati: II TM Distance: <3 FB Neck ROM: Full  Mouth opening: Limited Mouth Opening  Dental  (+) Teeth Intact and Dental Advisory Given   Pulmonary neg pulmonary ROS,    Pulmonary exam normal       Cardiovascular Exercise Tolerance: Good neg cardio ROS     Neuro/Psych    GI/Hepatic Neg liver ROS, GERD-  Controlled and Medicated,  Endo/Other  Thyroid nodules  Renal/GU negative Renal ROS     Musculoskeletal negative musculoskeletal ROS (+)   Abdominal Normal abdominal exam  (+)   Peds  Hematology negative hematology ROS (+)   Anesthesia Other Findings   Reproductive/Obstetrics negative OB ROS                          Anesthesia Physical Anesthesia Plan  ASA: II  Anesthesia Plan: General   Post-op Pain Management:    Induction: Intravenous  Airway Management Planned: Oral ETT  Additional Equipment:   Intra-op Plan:   Post-operative Plan: Extubation in OR  Informed Consent: I have reviewed the patients History and Physical, chart, labs and discussed the procedure including the risks, benefits and alternatives for the proposed anesthesia with the patient or authorized representative who has indicated his/her understanding and acceptance.   Dental advisory given  Plan Discussed with: CRNA and Surgeon  Anesthesia Plan Comments:         Anesthesia Quick Evaluation

## 2011-05-02 NOTE — Interval H&P Note (Signed)
History and Physical Interval Note:   05/02/2011   6:57 AM   Nancy Lee  has presented today for surgery, with the diagnosis of throid nodules  The various methods of treatment have been discussed with the patient and family. After consideration of risks, benefits and other options for treatment, the patient has consented to  Procedure(s): THYROIDECTOMY as a surgical intervention .  The patients' history has been reviewed, patient examined, no change in status, stable for surgery.  I have reviewed the patients' chart and labs.  Questions were answered to the patient's satisfaction.     CORNETT,THOMAS A.  MD

## 2011-05-02 NOTE — Op Note (Signed)
Thyroid Lobectomy Procedure Note  Indications: This patient presents with a palpable nodule on the bilateral side of the neck.  Fine needle aspiration cytology revealed findings consistent with a follicular neoplasm.  The patient now presents for a thyroid lobectomy and possible total thyroidectomy.  Pre-operative Diagnosis: bilateral thyroid lobectomy  Post-operative Diagnosis:  bilateral thyroid lobectomy  Surgeon: CORNETT,THOMAS A.   Assistants: Consuello Bossier MD  Anesthesia: General endotracheal anesthesia  ASA Class: 3  Procedure Details  The patient was seen in the Holding Room. The risks, benefits, complications, treatment options, and expected outcomes were discussed with the patient. The possibilities of reaction to medication, pulmonary aspiration, perforation of viscus, bleeding, recurrent infection, finding a normal thyroid, recurrently laryngeal nerve damage, the need for additional procedures, failure to diagnose a condition, and creating a complication requiring transfusion or operation were discussed with the patient. The patient concurred with the proposed plan, giving informed consent.  The site of surgery properly noted/marked. The patient was taken to Operating Room # 2, identified as Nancy Lee and the procedure verified as Thyroidectomy. A Time Out was held and the above information confirmed.  The patient was placed supine after induction of a general anesthetic.  The neck was extended and prepped and draped in standard fashion.  A 5 cm transverse cervical incision was created above the sternal notch within a natural skin fold.  The strap muscles were identified and divided at the midline.  Sharp dissection was used to mobilize the left thyroid lobe in a medial direction.  Dissection continued posteriorly to expose the tracheoesophageal groove and right carotid artery.  The recurrent laryngeal nerve was identified and preserved.  The thyroid lobe was mobilized  further and the superior and inferior pole vessels were divided with the harmonic scalpel and 2-0 silk suture.  The middle thyroid vein was divided with the harmonic scalpel and 2-0 silk suture.  Small vessels were likewise divided.  The gland was rotated in a medial direction and taken off the trachea using the harmonic scalpel.  The isthmus was removed off the thyroid cartilage using the harmonic scalpel.  The right thyroid lobe was removed in a similar fashion.  Both superior parathyroid glands were preserved. Inferior parathyroid glands were not seen.  The specimen was submitted to pathology for permanent  section.  A suture was placed for orientation purposes.   The wound was irrigated and inspected carefully. Sugicel snow was placed into the operative field.  The parathyroid tissue was found to be viable and the recurrent laryngeal nerve was left intact in its anatomic locations bilaterally.  The strap muscles were closed with interrupted 2-0 Vicryl suture.  The platysma was closed with interrupted 3-0 Vicryl suture, and the skin incision was closed with a 4-0 monocryl subcuticular closure.  Dermabond was applied across the incision.  Instrument, sponge, and needle counts were correct prior to closure and at the conclusion of the case.   Findings: The bilateral recurrent laryngeal nerve was identified.  Parathyroid tissue was identified and preserved with a viable blood supply.  A nodule was found within the thyroid lobe, located in the lower pole.  This measured approximately 3 cm in diameter and mobile from surrounding structures.   Estimated Blood Loss:  less than 50 mL         Drains: none         Total IV Fluids: 1000 ml         Specimens: thyroid  Implants: none         Complications:  None; patient tolerated the procedure well.         Disposition: PACU - hemodynamically stable.         Condition: stable  Attending Attestation: I was present and scrubbed for the entire  procedure.

## 2011-05-02 NOTE — Progress Notes (Signed)
Pt reports being allergic to PCN cream and not injections,ETC.  Confered w/ short stay staff. Ancef 1Gram IV placed on chart w/note to OR ZO:XWRU info.//L.Love,RN

## 2011-05-02 NOTE — Transfer of Care (Signed)
Immediate Anesthesia Transfer of Care Note  Patient: Nancy Lee  Procedure(s) Performed:  THYROIDECTOMY  Patient Location: PACU  Anesthesia Type: General  Level of Consciousness: awake, alert  and oriented  Airway & Oxygen Therapy: Patient Spontanous Breathing and Patient connected to nasal cannula oxygen  Post-op Assessment: Report given to PACU RN and Post -op Vital signs reviewed and stable  Post vital signs: Reviewed and stable  Complications: No apparent anesthesia complications

## 2011-05-02 NOTE — Anesthesia Postprocedure Evaluation (Signed)
  Anesthesia Post-op Note  Patient: Nancy Lee  Procedure(s) Performed:  THYROIDECTOMY  Patient Location: PACU  Anesthesia Type: General  Level of Consciousness: awake, alert  and oriented  Airway and Oxygen Therapy: Patient Spontanous Breathing and Patient connected to nasal cannula oxygen  Post-op Pain: mild  Post-op Assessment: Post-op Vital signs reviewed, Patient's Cardiovascular Status Stable, Respiratory Function Stable, Patent Airway, No signs of Nausea or vomiting and Pain level controlled  Post-op Vital Signs: Reviewed and stable  Complications: No apparent anesthesia complications

## 2011-05-02 NOTE — Preoperative (Signed)
Beta Blockers   Reason not to administer Beta Blockers:Not Applicable 

## 2011-05-02 NOTE — Anesthesia Procedure Notes (Signed)
Procedure Name: Intubation Date/Time: 05/02/2011 7:53 AM Performed by: Delbert Harness Pre-anesthesia Checklist: Patient identified, Emergency Drugs available, Suction available, Patient being monitored and Timeout performed Patient Re-evaluated:Patient Re-evaluated prior to inductionOxygen Delivery Method: Circle System Utilized Preoxygenation: Pre-oxygenation with 100% oxygen Intubation Type: IV induction Ventilation: Mask ventilation without difficulty and Oral airway inserted - appropriate to patient size Grade View: Grade II Tube type: Oral Tube size: 7.0 mm Number of attempts: 1 Airway Equipment and Method: video-laryngoscopy and stylet Placement Confirmation: ETT inserted through vocal cords under direct vision,  breath sounds checked- equal and bilateral and positive ETCO2 Secured at: 21 cm Tube secured with: Tape Dental Injury: Teeth and Oropharynx as per pre-operative assessment

## 2011-05-03 LAB — CBC
HCT: 37 % (ref 36.0–46.0)
Hemoglobin: 12.1 g/dL (ref 12.0–15.0)
MCH: 29.4 pg (ref 26.0–34.0)
MCHC: 32.7 g/dL (ref 30.0–36.0)
MCV: 90 fL (ref 78.0–100.0)
Platelets: 200 10*3/uL (ref 150–400)
RBC: 4.11 MIL/uL (ref 3.87–5.11)
RDW: 13.2 % (ref 11.5–15.5)
WBC: 8.3 10*3/uL (ref 4.0–10.5)

## 2011-05-03 LAB — BASIC METABOLIC PANEL
BUN: 8 mg/dL (ref 6–23)
CO2: 28 mEq/L (ref 19–32)
Calcium: 9.3 mg/dL (ref 8.4–10.5)
Chloride: 102 mEq/L (ref 96–112)
Creatinine, Ser: 0.73 mg/dL (ref 0.50–1.10)
GFR calc Af Amer: >90 mL/min (ref >90)
GFR calc non Af Amer: >90 mL/min (ref >90)
Glucose, Bld: 90 mg/dL (ref 70–99)
Potassium: 3.5 mEq/L (ref 3.5–5.1)
Sodium: 139 mEq/L (ref 135–145)

## 2011-05-03 MED ORDER — LEVOTHYROXINE SODIUM 100 MCG PO TABS
100.0000 ug | ORAL_TABLET | Freq: Every day | ORAL | Status: DC
  Administered 2011-05-03: 100 ug via ORAL
  Filled 2011-05-03 (×3): qty 1

## 2011-05-03 MED ORDER — LEVOTHYROXINE SODIUM 100 MCG PO TABS: 100.0000 ug | ORAL_TABLET | Freq: Every day | ORAL | Status: AC

## 2011-05-03 MED ORDER — OXYCODONE-ACETAMINOPHEN 5-325 MG PO TABS: 1.0000 | ORAL_TABLET | ORAL | Status: AC | PRN

## 2011-05-03 NOTE — Discharge Summary (Signed)
Admitting diagnosis:  Bilateral thyroid nodules  Discharge diagnosis: same   Date of admission:  05/02/2011  Date of discharge :  05/03/2011  Procedure performed: Total thyroidectomy  Hospital course:  Unremarkable.  Calcium post op 9.1.  Min swelling.  Min hoarseness.  Tolerating diet.  Pain well controlled.  No fever,  Chills  Or stridor.  Wound clean,  Dry and intact.  D/c in satisfactory condition.   Discharge instructions:  See notes  Discharge medications:  See notes.  Discharge instructions:  Follow up in 3 weeks.  Resume driving in 2 weeks.  OK to shower.  Condition at discharge : stable

## 2011-05-03 NOTE — Progress Notes (Signed)
1 Day Post-Op  Subjective: Doing well.  Min hoarse.  No difficulty breathing.  Objective: Vital signs in last 24 hours: Temp:  [97 F (36.1 C)-98.6 F (37 C)] 98.4 F (36.9 C) (11/14 0615) Pulse Rate:  [63-103] 103  (11/14 0615) Resp:  [12-20] 16  (11/14 0615) BP: (128-158)/(73-91) 133/73 mmHg (11/14 0615) SpO2:  [96 %-100 %] 97 % (11/14 0615) Weight:  [169 lb (76.658 kg)] 169 lb (76.658 kg) (11/13 1940) Last BM Date: 05/01/11  Intake/Output from previous day: 11/13 0701 - 11/14 0700 In: 2630 [P.O.:240; I.V.:2390] Out: 1475 [Urine:1450; Blood:25] Intake/Output this shift:   Incision clean dry intact.  Min swelling.  Min hoarse.   Lab Results:   [facility] 05/02/11 1143  WBC 10.7*  HGB 11.8*  HCT 35.7*  PLT 177   BMET  Basename 05/02/11 1555  NA 140  K 4.0  CL 103  CO2 28  GLUCOSE 136*  BUN 14  CREATININE 0.63  CALCIUM 9.1   PT/INR No results found for this basename: LABPROT:2,INR:2 in the last 72 hours ABG No results found for this basename: PHART:2,PCO2:2,PO2:2,HCO3:2 in the last 72 hours  Studies/Results: No results found.  Anti-infectives: Anti-infectives     Start     Dose/Rate Route Frequency Ordered Stop   05/01/11 1615   ceFAZolin (ANCEF) IVPB 1 g/50 mL premix        1 g 100 mL/hr over 30 Minutes Intravenous 60 min pre-op 05/01/11 1614 05/02/11 0753          Assessment/Plan: s/p Procedure(s): THYROIDECTOMY Discharge  LOS: 1 day    CORNETT,THOMAS A. 05/03/2011

## 2011-05-05 ENCOUNTER — Encounter (HOSPITAL_COMMUNITY): Payer: Self-pay | Admitting: Surgery

## 2011-05-05 ENCOUNTER — Encounter (INDEPENDENT_AMBULATORY_CARE_PROVIDER_SITE_OTHER): Payer: Self-pay | Admitting: Surgery

## 2011-05-22 ENCOUNTER — Ambulatory Visit (INDEPENDENT_AMBULATORY_CARE_PROVIDER_SITE_OTHER): Payer: 59 | Admitting: Surgery

## 2011-05-22 ENCOUNTER — Encounter (INDEPENDENT_AMBULATORY_CARE_PROVIDER_SITE_OTHER): Payer: Self-pay | Admitting: Surgery

## 2011-05-22 VITALS — BP 120/80 | HR 72 | Temp 98.2°F | Resp 12 | Ht 65.0 in | Wt 160.0 lb

## 2011-05-22 DIAGNOSIS — Z9889 Other specified postprocedural states: Secondary | ICD-10-CM

## 2011-05-22 NOTE — Progress Notes (Signed)
The patient returns today. She is 3 weeks out from total thyroidectomy for a goiter. Pathology shows 3 foci of papillary thyroid carcinoma the largest was 3 mm within a dominant nodule that was a benign adenoma. She still is less sore throat and minimal hoarseness.  Exam: Well healed cervical incision. Trachea midline. Minimal hoarseness  Impression T1 NX thyroid papillary carcinoma  Plan: This will require no further treatment. The areas were no more than 3 mm and there were only 3 of these areas within then dominant nodule. I will follow a thyroglobulin level and check this in 6 months. Followup 6 months.

## 2011-05-22 NOTE — Patient Instructions (Signed)
Thyroid Cancer  The thyroid gland is a butterfly-shaped gland in the middle of the neck, located just below the voice box. It makes thyroid hormone. Thyroid hormone has an effect on nearly all tissues of your body by regulating your metabolism. Metabolism is the breakdown and use of food that you eat or energy that is stored in your body. Your metabolism affects your heart rate, blood pressure, body temperature, and weight. Thyroid cancer occurs when cells in your thyroid gland begin to form abnormally (mutate). This mutation allows the abnormally formed cells to grow and multiply rapidly. These abnormal cells do not die as normal cells do. The accumulation of these abnormal cells forms a cancerous tumor.  There are 4 main types of thyroid cancer: 1. Papillary cancer. This is the least harmful type of thyroid cancer. Typically, it affects women of child-bearing age. It can be caused by exposure to radiation.  2. Follicular cancer. Follicular cancer accounts for about 20% of all thyroid cancers and is more likely to recur (come back after treatment) and spread.  3. Medullary cancer. Although most thyroid cancers are not passed down through the genes of family members (genetic), this type of thyroid cancer can be inherited. If your family members have this gene for this cancer, you can be tested for it. This cancer develops in most people who have this gene.  4. Anaplastic cancer. This is the rarest and most harmful (malignant) type of thyroid cancer. It spreads quickly to structures such as your windpipe (trachea), causing compression of your trachea and breathing difficulties. It is such an aggressive type of cancer that it is difficult to completely get rid of. Anaplastic thyroid cancer is more common in people 29 years old or older.  RISK FACTORS  Radiation exposure or radiation treatments to your head and neck during infancy or childhood.   Enlarged thyroid.   Family history of thyroid disease.    Female sex.   Asian race.  SYMPTOMS   Enlargement of your thyroid gland or lumps or swelling in your neck.   Hoarseness or a change in your voice.   Cough or coughing up blood.   Difficulty swallowing.   Shortness of breath if your trachea is compressed.  DIAGNOSIS  Your caregiver will physically examine your thyroid. If a lump is found, an ultrasound test may be done. Your caregiver may order blood tests to see if a lump is causing your thyroid to make too much thyroid hormone. Sometimes a biopsy is performed. This is the removal of a small piece of tissue for examination under a microscope by a specialist Psychologist, educational). This is often done with a very fine needle. The pathologist can tell if the tissue sample contains cancer. TREATMENT  Some types of thyroid cancer grow faster than others. The success of treatment depends on the type of thyroid cancer, whether it has spread to other parts of the body, and the patient's age and overall health. Most thyroid cancers can be treated successfully. Typically, removal of most or all of the thyroid gland (thyroidectomy) is the course of treatment for most thyroid cancers. In some cases it is necessary to remove lymph nodes in the neck that are close to the thyroid. Often, follow-up procedures are necessary to ensure that all the cancer is eliminated and to keep it from recurring. These procedures include:   Thyroid hormone therapy. This can be done by taking a pill. This medication serves 2 purposes:   It replaces the hormone in your  body that is normally made by your thyroid.   It suppresses a thyroid stimulating hormone, a hormone that activates your thyroid but also makes any remaining cancer cells grow.   Radioactive iodine treatment. This treatment often is used to destroy any remaining thyroid tissue and cancerous tissue that could not be seen and was not removed during the thyroidectomy. This treatment also may be used to treat thyroid  cancer that has spread to other parts of the body or has recurred.   External radiation. This typically is used to treat thyroid cancer that has spread to your bones. The treatment is administered a few minutes at a time, 5 days per week, for 5 to 6 weeks.  PREVENTION  In some people who have the gene linked to medullary cancer, thyroidectomy is done to prevent cancer from developing. Document Released: 04/28/2004 Document Revised: 02/15/2011 Document Reviewed: 08/11/2010 [facility] Patient Information 2012 [place], [place].

## 2011-05-31 ENCOUNTER — Encounter (INDEPENDENT_AMBULATORY_CARE_PROVIDER_SITE_OTHER): Payer: 59 | Admitting: Surgery

## 2011-08-25 ENCOUNTER — Other Ambulatory Visit: Payer: Self-pay | Admitting: Gynecology

## 2011-08-25 DIAGNOSIS — Z1231 Encounter for screening mammogram for malignant neoplasm of breast: Secondary | ICD-10-CM

## 2011-10-20 ENCOUNTER — Encounter (INDEPENDENT_AMBULATORY_CARE_PROVIDER_SITE_OTHER): Payer: Self-pay | Admitting: Surgery

## 2011-10-20 ENCOUNTER — Ambulatory Visit (INDEPENDENT_AMBULATORY_CARE_PROVIDER_SITE_OTHER): Payer: 59 | Admitting: Surgery

## 2011-10-20 VITALS — BP 142/102 | HR 88 | Temp 98.0°F | Resp 14 | Ht 65.0 in | Wt 171.3 lb

## 2011-10-20 DIAGNOSIS — Z85850 Personal history of malignant neoplasm of thyroid: Secondary | ICD-10-CM

## 2011-10-20 LAB — TSH: TSH: 4.919 u[IU]/mL — ABNORMAL HIGH (ref 0.350–4.500)

## 2011-10-20 NOTE — Patient Instructions (Signed)
Return 6 months.  Blood work and ultrasound ordered.

## 2011-10-20 NOTE — Progress Notes (Signed)
Subjective:     Patient ID: Nancy Lee, female   DOB: 05/19/1956, 56 y.o.   MRN: [id number]  HPI Patient returns 6 months after a total thyroidectomy for stage I thyroid cancer papillary origin. The largest focus was 3 mm. She is on Synthroid. She has gained 4 pounds. She does complain of some neck swelling from time to time. Her voice is a little course.     Review of Systems  Constitutional: Positive for unexpected weight change.  HENT: Positive for neck pain.   Eyes: Negative.   Respiratory: Negative.   Cardiovascular: Negative.        Objective:   Physical Exam  Constitutional: She appears well-developed and well-nourished.  HENT:  Head: Normocephalic and atraumatic.  Eyes: EOM are normal. Pupils are equal, round, and reactive to light.  Neck: Normal range of motion and phonation normal. Neck supple.    Lymphadenopathy:    She has cervical adenopathy.       Right cervical: No superficial cervical, no deep cervical and no posterior cervical adenopathy present.      Left cervical: No superficial cervical, no deep cervical and no posterior cervical adenopathy present.       Assessment:     Stage 1 thyroid cancer s/p total thyroidectomy    Plan:     Check TSH level and thyroglobulin level.  Check cervical ultrasound given history of neck swelling.  Return in 6 months

## 2011-10-23 ENCOUNTER — Telehealth (INDEPENDENT_AMBULATORY_CARE_PROVIDER_SITE_OTHER): Payer: Self-pay

## 2011-10-23 NOTE — Telephone Encounter (Signed)
Patient called back. i told her to increase synthroid to 150 mcg.

## 2011-10-23 NOTE — Telephone Encounter (Signed)
Patient needs to increase synthroid to 150 mcg. Left message for patient to call me back so I could give her the information.

## 2011-10-24 LAB — THYROGLOBULIN PANEL
Thyroglobulin Ab: <20 U/mL (ref <40.0)
Thyroglobulin: 2.1 ng/mL (ref 0.0–55.0)

## 2011-10-27 ENCOUNTER — Ambulatory Visit
Admission: RE | Admit: 2011-10-27 | Discharge: 2011-10-27 | Disposition: A | Discharge status: Home / Self Care | Payer: 59 | Source: Ambulatory Visit | Attending: Surgery | Admitting: Surgery

## 2011-10-27 ENCOUNTER — Telehealth (INDEPENDENT_AMBULATORY_CARE_PROVIDER_SITE_OTHER): Payer: Self-pay | Admitting: General Surgery

## 2011-10-27 ENCOUNTER — Other Ambulatory Visit: Payer: 59

## 2011-10-27 DIAGNOSIS — Z85850 Personal history of malignant neoplasm of thyroid: Secondary | ICD-10-CM

## 2011-10-27 NOTE — Telephone Encounter (Signed)
Pt states she received phone call to increase her thyroid medicine to 150 mcg QD.  She needs refill now, called to [facility]: [phone].  Levothyroxine 150 mcg, # 30, 1 po QD, 3 refills called to answering machine at 11:55.

## 2011-11-01 ENCOUNTER — Other Ambulatory Visit (INDEPENDENT_AMBULATORY_CARE_PROVIDER_SITE_OTHER): Payer: Self-pay

## 2011-11-01 DIAGNOSIS — Z85850 Personal history of malignant neoplasm of thyroid: Secondary | ICD-10-CM

## 2011-11-20 ENCOUNTER — Encounter (HOSPITAL_COMMUNITY)
Admission: RE | Admit: 2011-11-20 | Discharge: 2011-11-20 | Disposition: A | Discharge status: Home / Self Care | Payer: 59 | Source: Ambulatory Visit | Attending: Surgery | Admitting: Surgery

## 2011-11-20 DIAGNOSIS — Z85850 Personal history of malignant neoplasm of thyroid: Secondary | ICD-10-CM | POA: Insufficient documentation

## 2011-11-20 MED ORDER — THYROTROPIN ALFA 1.1 MG IM SOLR
0.9000 mg | INTRAMUSCULAR | Status: AC
  Administered 2011-11-20: 0.9 mg via INTRAMUSCULAR
  Filled 2011-11-20: qty 0.9

## 2011-11-21 ENCOUNTER — Ambulatory Visit (HOSPITAL_COMMUNITY)
Admission: RE | Admit: 2011-11-21 | Discharge: 2011-11-21 | Payer: 59 | Source: Ambulatory Visit | Attending: Surgery | Admitting: Surgery

## 2011-11-21 MED ORDER — THYROTROPIN ALFA 1.1 MG IM SOLR
0.9000 mg | INTRAMUSCULAR | Status: AC
  Administered 2011-11-21: 0.9 mg via INTRAMUSCULAR
  Filled 2011-11-21: qty 0.9

## 2011-11-22 ENCOUNTER — Encounter (HOSPITAL_COMMUNITY)
Admission: RE | Admit: 2011-11-22 | Discharge: 2011-11-22 | Disposition: A | Discharge status: Home / Self Care | Payer: 59 | Source: Ambulatory Visit | Attending: Surgery | Admitting: Surgery

## 2011-11-24 ENCOUNTER — Ambulatory Visit (HOSPITAL_COMMUNITY)
Admission: RE | Admit: 2011-11-24 | Discharge: 2011-11-24 | Disposition: A | Discharge status: Home / Self Care | Payer: 59 | Source: Ambulatory Visit | Attending: Surgery | Admitting: Surgery

## 2011-11-24 DIAGNOSIS — Z85850 Personal history of malignant neoplasm of thyroid: Secondary | ICD-10-CM | POA: Insufficient documentation

## 2011-11-24 MED ORDER — SODIUM IODIDE I 131 CAPSULE
4.0000 | Freq: Once | INTRAVENOUS | Status: AC | PRN
  Administered 2011-11-24: 4 via ORAL

## 2011-12-22 ENCOUNTER — Ambulatory Visit
Admission: RE | Admit: 2011-12-22 | Discharge: 2011-12-22 | Disposition: A | Discharge status: Home / Self Care | Payer: 59 | Source: Ambulatory Visit | Attending: Gynecology | Admitting: Gynecology

## 2011-12-22 ENCOUNTER — Other Ambulatory Visit: Payer: Self-pay | Admitting: Gynecology

## 2011-12-22 DIAGNOSIS — Z1231 Encounter for screening mammogram for malignant neoplasm of breast: Secondary | ICD-10-CM

## 2011-12-29 ENCOUNTER — Ambulatory Visit: Payer: 59

## 2012-01-05 ENCOUNTER — Other Ambulatory Visit: Payer: Self-pay | Admitting: Gynecology

## 2012-03-13 ENCOUNTER — Encounter (INDEPENDENT_AMBULATORY_CARE_PROVIDER_SITE_OTHER): Payer: Self-pay | Admitting: Surgery

## 2012-04-16 ENCOUNTER — Other Ambulatory Visit (INDEPENDENT_AMBULATORY_CARE_PROVIDER_SITE_OTHER): Payer: Self-pay | Admitting: Surgery

## 2012-04-16 LAB — TSH: TSH: 0.715 u[IU]/mL (ref 0.350–4.500)

## 2012-04-17 LAB — THYROGLOBULIN ANTIBODY: Thyroglobulin Ab: <20 U/mL (ref <40.0)

## 2012-04-19 ENCOUNTER — Encounter (HOSPITAL_COMMUNITY): Admission: RE | Payer: Self-pay | Source: Ambulatory Visit

## 2012-04-19 ENCOUNTER — Ambulatory Visit (HOSPITAL_COMMUNITY): Admission: RE | Admit: 2012-04-19 | Payer: 59 | Source: Ambulatory Visit | Admitting: Gastroenterology

## 2012-04-19 ENCOUNTER — Encounter (INDEPENDENT_AMBULATORY_CARE_PROVIDER_SITE_OTHER): Payer: Self-pay | Admitting: Surgery

## 2012-04-19 ENCOUNTER — Ambulatory Visit (INDEPENDENT_AMBULATORY_CARE_PROVIDER_SITE_OTHER): Payer: 59 | Admitting: Surgery

## 2012-04-19 VITALS — BP 140/72 | HR 72 | Temp 97.5°F | Resp 16 | Ht 65.0 in | Wt 173.0 lb

## 2012-04-19 DIAGNOSIS — Z85850 Personal history of malignant neoplasm of thyroid: Secondary | ICD-10-CM

## 2012-04-19 SURGERY — EGD (ESOPHAGOGASTRODUODENOSCOPY)
Anesthesia: Moderate Sedation

## 2012-04-19 NOTE — Patient Instructions (Addendum)
Return 6 months

## 2012-04-19 NOTE — Progress Notes (Signed)
Subjective:     Patient ID: Nancy Lee, female   DOB: 22-Dec-1955, 56 y.o.   MRN: [id number]  HPIpatient returns in followup for her stage I thyroid cancer diagnosed November 2012 by total thyroidectomy for benign thyroid nodules. 3 mm focus in 2 other subcentimeter focus identified in the specimen. No evidence of lymphovascular invasion. She's doing well. Had a radioactive iodine study in June of 2013 which showed baseline activity in thyroid bed and no evidence of metastatic disease. She's cut her Synthroid back due to hot flashes. She feels well.   Review of Systems  Constitutional: Negative.   HENT: Negative.   Eyes: Negative.   Respiratory: Negative.   Cardiovascular: Negative.        Objective:   Physical Exam  Constitutional: She appears well-developed and well-nourished.  HENT:  Head: Normocephalic and atraumatic.  Eyes: EOM are normal. Pupils are equal, round, and reactive to light.  Neck: No tracheal tenderness present. No tracheal deviation present.    Pulmonary/Chest: Effort normal and breath sounds normal. No stridor.  Lymphadenopathy:    She has no cervical adenopathy.  Skin: Skin is warm and dry.       Assessment:     History stage I thyroid cancer    Plan:     Return 6 months. Her thyroid globulin level is less than 0.2 and TSH is 0.7. Continue to follow blood work. Continue Synthroid at 125 mcg daily

## 2012-09-24 ENCOUNTER — Other Ambulatory Visit: Payer: Self-pay

## 2012-09-24 DIAGNOSIS — Z1231 Encounter for screening mammogram for malignant neoplasm of breast: Secondary | ICD-10-CM

## 2012-10-21 ENCOUNTER — Other Ambulatory Visit (INDEPENDENT_AMBULATORY_CARE_PROVIDER_SITE_OTHER): Payer: Self-pay

## 2012-10-21 ENCOUNTER — Encounter (INDEPENDENT_AMBULATORY_CARE_PROVIDER_SITE_OTHER): Payer: Self-pay | Admitting: Surgery

## 2012-10-21 ENCOUNTER — Ambulatory Visit (INDEPENDENT_AMBULATORY_CARE_PROVIDER_SITE_OTHER): Payer: 59 | Admitting: Surgery

## 2012-10-21 VITALS — BP 122/84 | HR 72 | Resp 16 | Ht 64.0 in | Wt 169.8 lb

## 2012-10-21 DIAGNOSIS — Z85850 Personal history of malignant neoplasm of thyroid: Secondary | ICD-10-CM

## 2012-10-21 NOTE — Patient Instructions (Signed)
RETURN 1 YEAR.  WILL RECHECK BLOOD WORK.

## 2012-10-21 NOTE — Progress Notes (Signed)
Subjective:     Patient ID: Nancy Lee, female   DOB: 1956/01/03, 57 y.o.   MRN: [id number]  HPIpatient returns in followup for her stage I thyroid cancer diagnosed November 2012 by total thyroidectomy for benign thyroid nodules. 3 mm focus in 2 other subcentimeter focus identified in the specimen. No evidence of lymphovascular invasion. She's doing well. Had a radioactive iodine study in June of 2013 which showed baseline activity in thyroid bed and no evidence of metastatic disease. She's cut her Synthroid back due to hot flashes. She feels well.   Review of Systems  Constitutional: Negative.   HENT: Negative.   Eyes: Negative.   Respiratory: Negative.   Cardiovascular: Negative.        Objective:   Physical Exam  Constitutional: She appears well-developed and well-nourished.  HENT:  Head: Normocephalic and atraumatic.  Eyes: EOM are normal. Pupils are equal, round, and reactive to light.  Neck: No tracheal tenderness present. No tracheal deviation present.    Pulmonary/Chest: Effort normal and breath sounds normal. No stridor.  Lymphadenopathy:    She has no cervical adenopathy.  Skin: Skin is warm and dry.       Assessment:     History stage I thyroid cancer    Plan:     Return 12 months. RECHECK BLOOD WORK. Continue to follow blood work. Continue Synthroid at 125 mcg daily

## 2012-10-22 ENCOUNTER — Other Ambulatory Visit (INDEPENDENT_AMBULATORY_CARE_PROVIDER_SITE_OTHER): Payer: Self-pay | Admitting: Surgery

## 2012-10-22 LAB — TSH: TSH: 0.522 u[IU]/mL (ref 0.350–4.500)

## 2012-10-23 LAB — THYROGLOBULIN ANTIBODY: Thyroglobulin Ab: <20 U/mL (ref <40.0)

## 2012-10-24 ENCOUNTER — Telehealth (INDEPENDENT_AMBULATORY_CARE_PROVIDER_SITE_OTHER): Payer: Self-pay

## 2012-10-24 NOTE — Telephone Encounter (Signed)
Called patient and let her know lab work came back normal.

## 2012-12-23 ENCOUNTER — Ambulatory Visit: Payer: 59

## 2013-01-10 ENCOUNTER — Ambulatory Visit: Payer: 59

## 2013-01-10 ENCOUNTER — Ambulatory Visit
Admission: RE | Admit: 2013-01-10 | Discharge: 2013-01-10 | Disposition: A | Discharge status: Home / Self Care | Payer: 59 | Source: Ambulatory Visit

## 2013-01-10 DIAGNOSIS — Z1231 Encounter for screening mammogram for malignant neoplasm of breast: Secondary | ICD-10-CM

## 2013-10-02 ENCOUNTER — Telehealth (INDEPENDENT_AMBULATORY_CARE_PROVIDER_SITE_OTHER): Payer: Self-pay | Admitting: *Deleted

## 2013-10-02 NOTE — Telephone Encounter (Signed)
Pt needs a order for her lab work.  Her f/u appt is 5.4.15.  Please advise!  Anderson Malta

## 2013-10-03 ENCOUNTER — Other Ambulatory Visit (INDEPENDENT_AMBULATORY_CARE_PROVIDER_SITE_OTHER): Payer: Self-pay | Admitting: Surgery

## 2013-10-03 DIAGNOSIS — Z85850 Personal history of malignant neoplasm of thyroid: Secondary | ICD-10-CM

## 2013-10-03 NOTE — Telephone Encounter (Signed)
Needs a TSH and thyroglobulin panel before her visit

## 2013-10-06 ENCOUNTER — Other Ambulatory Visit (INDEPENDENT_AMBULATORY_CARE_PROVIDER_SITE_OTHER): Payer: Self-pay

## 2013-10-06 DIAGNOSIS — Z85850 Personal history of malignant neoplasm of thyroid: Secondary | ICD-10-CM

## 2013-10-06 NOTE — Telephone Encounter (Signed)
LMOM> orders in epic, labs slip mailed to pt.

## 2013-10-16 LAB — TSH: TSH: 1.08 u[IU]/mL (ref 0.350–4.500)

## 2013-10-17 LAB — THYROGLOBULIN ANTIBODY: Thyroglobulin Ab: <20 IU/mL (ref <40.0)

## 2013-10-20 ENCOUNTER — Encounter (INDEPENDENT_AMBULATORY_CARE_PROVIDER_SITE_OTHER): Payer: Self-pay | Admitting: Surgery

## 2013-10-20 ENCOUNTER — Ambulatory Visit (INDEPENDENT_AMBULATORY_CARE_PROVIDER_SITE_OTHER): Payer: 59 | Admitting: Surgery

## 2013-10-20 VITALS — BP 130/74 | HR 76 | Temp 98.5°F | Resp 16 | Ht 64.0 in | Wt 164.0 lb

## 2013-10-20 DIAGNOSIS — Z85850 Personal history of malignant neoplasm of thyroid: Secondary | ICD-10-CM

## 2013-10-20 NOTE — Progress Notes (Signed)
Subjective:     Patient ID: Nancy Lee, female   DOB: 06-14-1956, 58 y.o.   MRN: [id number]  HPIpatient returns in followup for her stage I thyroid cancer diagnosed November 2012 by total thyroidectomy for benign thyroid nodules. 3 mm focus in 2 other subcentimeter focus identified in the specimen. No evidence of lymphovascular invasion. She's doing well. Had a radioactive iodine study in June of 2013 which showed baseline activity in thyroid bed and no evidence of metastatic disease. She's cut her Synthroid back due to hot flashes. She feels well.   Review of Systems  Constitutional: Negative.   HENT: Negative.   Eyes: Negative.   Respiratory: Negative.   Cardiovascular: Negative.        Objective:   Physical Exam  Constitutional: She appears well-developed and well-nourished.  HENT:  Head: Normocephalic and atraumatic.  Eyes: EOM are normal. Pupils are equal, round, and reactive to light.  Neck: No tracheal tenderness present. No tracheal deviation present.  NECK INCISION intact. No masses  Pulmonary/Chest: Effort normal and breath sounds normal. No stridor.  Lymphadenopathy:    She has no cervical adenopathy.  Skin: Skin is warm and dry.  Thyroglobulin Ab Thyroglobulin antibody       Collected: 10/16/13 0709   Resulting lab: [facility]   Reference range: <40.0 IU/mL   Value: <20.0        Assessment:     History stage I thyroid cancer    Plan:     Return 12 months. RECHECK BLOOD WORK. Continue to follow thyroglobulin and TSH . Continue Synthroid at 125 mcg daily

## 2013-10-20 NOTE — Patient Instructions (Signed)
Return 1 year. 

## 2013-10-22 ENCOUNTER — Other Ambulatory Visit: Payer: Self-pay

## 2013-10-22 DIAGNOSIS — Z1231 Encounter for screening mammogram for malignant neoplasm of breast: Secondary | ICD-10-CM

## 2014-01-12 ENCOUNTER — Ambulatory Visit
Admission: RE | Admit: 2014-01-12 | Discharge: 2014-01-12 | Disposition: A | Discharge status: Home / Self Care | Payer: 59 | Source: Ambulatory Visit

## 2014-01-12 ENCOUNTER — Encounter (INDEPENDENT_AMBULATORY_CARE_PROVIDER_SITE_OTHER): Payer: Self-pay

## 2014-01-12 DIAGNOSIS — Z1231 Encounter for screening mammogram for malignant neoplasm of breast: Secondary | ICD-10-CM

## 2014-01-13 ENCOUNTER — Other Ambulatory Visit: Payer: Self-pay | Admitting: Gynecology

## 2014-01-13 DIAGNOSIS — R928 Other abnormal and inconclusive findings on diagnostic imaging of breast: Secondary | ICD-10-CM

## 2014-01-20 ENCOUNTER — Ambulatory Visit
Admission: RE | Admit: 2014-01-20 | Discharge: 2014-01-20 | Disposition: A | Discharge status: Home / Self Care | Payer: 59 | Source: Ambulatory Visit | Attending: Gynecology | Admitting: Gynecology

## 2014-01-20 ENCOUNTER — Other Ambulatory Visit: Payer: 59

## 2014-01-20 DIAGNOSIS — R928 Other abnormal and inconclusive findings on diagnostic imaging of breast: Secondary | ICD-10-CM

## 2014-01-21 ENCOUNTER — Other Ambulatory Visit: Payer: 59

## 2014-07-13 ENCOUNTER — Other Ambulatory Visit: Payer: Self-pay

## 2014-07-13 DIAGNOSIS — Z1231 Encounter for screening mammogram for malignant neoplasm of breast: Secondary | ICD-10-CM

## 2014-11-03 ENCOUNTER — Encounter (HOSPITAL_COMMUNITY): Payer: Self-pay | Admitting: Emergency Medicine

## 2014-11-03 ENCOUNTER — Emergency Department (INDEPENDENT_AMBULATORY_CARE_PROVIDER_SITE_OTHER)
Admission: EM | Admit: 2014-11-03 | Discharge: 2014-11-03 | Disposition: A | Discharge status: Home / Self Care | Payer: 59 | Source: Home / Self Care | Attending: Family Medicine | Admitting: Family Medicine

## 2014-11-03 DIAGNOSIS — S300XXA Contusion of lower back and pelvis, initial encounter: Secondary | ICD-10-CM

## 2014-11-03 DIAGNOSIS — W19XXXA Unspecified fall, initial encounter: Secondary | ICD-10-CM

## 2014-11-03 DIAGNOSIS — S39012A Strain of muscle, fascia and tendon of lower back, initial encounter: Secondary | ICD-10-CM

## 2014-11-03 MED ORDER — DICLOFENAC SODIUM 1 % TD GEL: 1.0000 "application " | Freq: Four times a day (QID) | TRANSDERMAL | Status: DC

## 2014-11-03 NOTE — ED Provider Notes (Signed)
CSN: [id number]     Arrival date & time 11/03/14  1848 History   First MD Initiated Contact with Patient 11/03/14 1948     Chief Complaint  Patient presents with  . Fall  . 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 This occurred approximately 8 days ago. She states that she believes she injured her back by twisting it when she tried to get up off the full. She did not have pain or discomfort for the first 2 days. On the third day she developed gradual pain in the left mid and lower back and left buttock. She states that this fall was due to a wet floor and she slipped. Her job requires her to stand at a [employer] for several hours during the day. This appears to exacerbate her discomfort. She denies focal paresthesias or weakness. She is ambulatory and has been working.   Past Medical History  Diagnosis Date  . Allergy   . GERD (gastroesophageal reflux disease)   . Hyperlipidemia    Past Surgical History  Procedure Laterality Date  . Bunionectomy    . Dilation and curette    . Tonsillectomy and adenoidectomy    . Abdominal hysterectomy  2000    fibroids  . Cholecystectomy  2007    gallstones  . Tonsillectomy      as a child  . Dilation and curettage of uterus    . Total thyroidectomy  05/02/2011  . Thyroidectomy  05/02/2011    Procedure: THYROIDECTOMY;  Surgeon: Joyice Faster. Cornett, MD;  Location: [facility];  Service: General;  Laterality: Bilateral;   Family History  Problem Relation Age of Onset  . Hypertension Mother   . Alcohol abuse Father   . Coronary artery disease     History  Substance Use Topics  . Smoking status: Former Smoker -- 0.25 packs/day for 2 years    Types: Cigarettes    Quit date: 04/26/2007  . Smokeless tobacco: Not on file  . Alcohol Use: 1.2 oz/week    2 Glasses of wine per  week   OB History    No data available     Review of Systems  Constitutional: Negative.   HENT: Negative.        Patient states that she did not strike her head or injure her neck. Denies head pain or neck pain.  Respiratory: Negative for cough and shortness of breath.   Cardiovascular: Negative for chest pain.  Gastrointestinal: Negative.   Genitourinary: Negative.   Musculoskeletal: Positive for myalgias and back pain. Negative for joint swelling, neck pain and neck stiffness.  Neurological: Negative for dizziness, tremors, seizures, syncope, facial asymmetry, numbness and headaches.  Psychiatric/Behavioral: Negative.     Allergies  Penicillins  Home Medications   Prior to Admission medications   Medication Sig Start Date End Date Taking? Authorizing Provider  cetirizine (ZYRTEC) 10 MG tablet  03/22/12  Yes Historical Provider, MD  aspirin 81 MG tablet Take 81 mg by mouth daily.    Historical Provider, MD  Calcium Carbonate (CALCIUM 600) 1500 MG TABS Take 2 tablets by mouth daily.     Historical Provider, MD  calcium citrate-vitamin D (CITRACAL+D) 315-200 MG-UNIT per tablet Take 1 tablet by mouth daily.      Historical Provider, MD  cholecalciferol (VITAMIN D) 1000 UNITS tablet Take 4,000 Units by mouth every morning.  Historical Provider, MD  diclofenac sodium (VOLTAREN) 1 % GEL Apply 1 application topically 4 (four) times daily. 11/03/14   Janne Napoleon, NP  hydrOXYzine (ATARAX/VISTARIL) 25 MG tablet  10/16/13   Historical Provider, MD  levothyroxine (SYNTHROID, LEVOTHROID) 100 MCG tablet Take 1 tablet (100 mcg total) by mouth daily before breakfast. 05/03/11 05/02/12  Erroll Luna, MD  meloxicam [facility]) 15 MG tablet  10/04/11   Historical Provider, MD  Multiple Vitamin (MULTIVITAMIN) capsule Take 1 capsule by mouth daily.      Historical Provider, MD  OMEPRAZOLE PO Take 40 mg by mouth 1 day or 1 dose. DELAYED RELEASE      Historical Provider, MD  venlafaxine XR (EFFEXOR-XR)  75 MG 24 hr capsule  10/15/13   Historical Provider, MD   BP 161/90 mmHg  Pulse 91  Temp(Src) 98.2 F (36.8 C) (Oral)  Resp 20  SpO2 100% Physical Exam  Constitutional: She is oriented to person, place, and time. She appears well-nourished. No distress.  HENT:  Head: Normocephalic and atraumatic.  Eyes: Conjunctivae and EOM are normal. Pupils are equal, round, and reactive to light.  Neck: Normal range of motion. Neck supple.  Cardiovascular: Normal rate and regular rhythm.   Pulmonary/Chest: No respiratory distress.  Musculoskeletal: Normal range of motion. She exhibits no edema.  There is tenderness to deep palpation to the left para lumbar and lower thoracic musculature. There is tenderness to the left upper and mid posterior buttock. She is able to flex her spine beyond 90 and touch her toes. She is able to flex laterally left and right as well as extension. There is no spinal tenderness, deformity, swelling or discoloration. No palpable step-off deformity. She is able to squat and and bring herself back up to a standing position. She has full range of motion of the right hip. No lateral hip tenderness. No deformity or discoloration. Ambulatory with full weightbearing.  Neurological: She is alert and oriented to person, place, and time. No cranial nerve deficit. She exhibits normal muscle tone.  Skin: Skin is warm and dry.  Psychiatric: She has a normal mood and affect.  Nursing note and vitals reviewed.   ED Course  Procedures (including critical care time) Labs Review Labs Reviewed - No data to display  Imaging Review No results found.   MDM   1. Fall, initial encounter   2. Back strain, initial encounter   3. Contusion, buttock, initial encounter    Diclofenac gel 4 times a day Heat several times a day Perform stretches as demonstrated several times a day Limit prolong standing as much as possible. Reassurance.       Janne Napoleon, NP 11/03/14 2012

## 2014-11-03 NOTE — ED Notes (Signed)
Reports falling in cooler at work injuring back.  C/o pain in the mid to lower back.   Mild relief with ibuprofen and tylenol.   Incident happened 10/26/2014.

## 2014-11-03 NOTE — Discharge Instructions (Signed)
Back Pain, Adult Low back pain is very common. About 1 in 5 people have back pain.The cause of low back pain is rarely dangerous. The pain often gets better over time.About half of people with a sudden onset of back pain feel better in just 2 weeks. About 8 in 10 people feel better by 6 weeks.  CAUSES Some common causes of back pain include:  Strain of the muscles or ligaments supporting the spine.  Wear and tear (degeneration) of the spinal discs.  Arthritis.  Direct injury to the back. DIAGNOSIS Most of the time, the direct cause of low back pain is not known.However, back pain can be treated effectively even when the exact cause of the pain is unknown.Answering your caregiver's questions about your overall health and symptoms is one of the most accurate ways to make sure the cause of your pain is not dangerous. If your caregiver needs more information, he or she may order lab work or imaging tests (X-rays or MRIs).However, even if imaging tests show changes in your back, this usually does not require surgery. HOME CARE INSTRUCTIONS For many people, back pain returns.Since low back pain is rarely dangerous, it is often a condition that people can learn to [facility] their own.   Remain active. It is stressful on the back to sit or stand in one place. Do not sit, drive, or stand in one place for more than 30 minutes at a time. Take short walks on level surfaces as soon as pain allows.Try to increase the length of time you walk each day.  Do not stay in bed.Resting more than 1 or 2 days can delay your recovery.  Do not avoid exercise or work.Your body is made to move.It is not dangerous to be active, even though your back may hurt.Your back will likely heal faster if you return to being active before your pain is gone.  Pay attention to your body when you bend and lift. Many people have less discomfortwhen lifting if they bend their knees, keep the load close to their bodies,and  avoid twisting. Often, the most comfortable positions are those that put less stress on your recovering back.  Find a comfortable position to sleep. Use a firm mattress and lie on your side with your knees slightly bent. If you lie on your back, put a pillow under your knees.  Only take over-the-counter or prescription medicines as directed by your caregiver. Over-the-counter medicines to reduce pain and inflammation are often the most helpful.Your caregiver may prescribe muscle relaxant drugs.These medicines help dull your pain so you can more quickly return to your normal activities and healthy exercise.  Put ice on the injured area.  Put ice in a plastic bag.  Place a towel between your skin and the bag.  Leave the ice on for 15-20 minutes, 03-04 times a day for the first 2 to 3 days. After that, ice and heat may be alternated to reduce pain and spasms.  Ask your caregiver about trying back exercises and gentle massage. This may be of some benefit.  Avoid feeling anxious or stressed.Stress increases muscle tension and can worsen back pain.It is important to recognize when you are anxious or stressed and learn ways to manage it.Exercise is a great option. SEEK MEDICAL CARE IF:  You have pain that is not relieved with rest or medicine.  You have pain that does not improve in 1 week.  You have new symptoms.  You are generally not feeling well. SEEK  IMMEDIATE MEDICAL CARE IF:   You have pain that radiates from your back into your legs.  You develop new bowel or bladder control problems.  You have unusual weakness or numbness in your arms or legs.  You develop nausea or vomiting.  You develop abdominal pain.  You feel faint. Document Released: 06/05/2005 Document Revised: 12/05/2011 Document Reviewed: 10/07/2013 [facility] Patient Information 2015 [place], [place]. This information is not intended to replace advice given to you by your health care provider. Make sure you  discuss any questions you have with your health care provider.  Contusion A contusion is a deep bruise. Contusions happen when an injury causes bleeding under the skin. Signs of bruising include pain, puffiness (swelling), and discolored skin. The contusion may turn blue, purple, or yellow. HOME CARE   Put ice on the injured area.  Put ice in a plastic bag.  Place a towel between your skin and the bag.  Leave the ice on for 15-20 minutes, 03-04 times a day.  Only take medicine as told by your doctor.  Rest the injured area.  If possible, raise (elevate) the injured area to lessen puffiness. GET HELP RIGHT AWAY IF:   You have more bruising or puffiness.  You have pain that is getting worse.  Your puffiness or pain is not helped by medicine. MAKE SURE YOU:   Understand these instructions.  Will watch your condition.  Will get help right away if you are not doing well or get worse. Document Released: 11/22/2007 Document Revised: 08/28/2011 Document Reviewed: 04/10/2011 [facility] Patient Information 2015 [place], [place]. This information is not intended to replace advice given to you by your health care provider. Make sure you discuss any questions you have with your health care provider.  Muscle Strain A muscle strain (pulled muscle) happens when a muscle is stretched beyond normal length. It happens when a sudden, violent force stretches your muscle too far. Usually, a few of the fibers in your muscle are torn. Muscle strain is common in athletes. Recovery usually takes 1-2 weeks. Complete healing takes 5-6 weeks.  HOME CARE   Follow the PRICE method of treatment to help your injury get better. Do this the first 2-3 days after the injury:  Protect. Protect the muscle to keep it from getting injured again.  Rest. Limit your activity and rest the injured body part.  Ice. Put ice in a plastic bag. Place a towel between your skin and the bag. Then, apply the ice and leave it on  from 15-20 minutes each hour. After the third day, switch to moist heat packs.  Compression. Use a splint or elastic bandage on the injured area for comfort. Do not put it on too tightly.  Elevate. Keep the injured body part above the level of your heart.  Only take medicine as told by your doctor.  Warm up before doing exercise to prevent future muscle strains. GET HELP IF:   You have more pain or puffiness (swelling) in the injured area.  You feel numbness, tingling, or notice a loss of strength in the injured area. MAKE SURE YOU:   Understand these instructions.  Will watch your condition.  Will get help right away if you are not doing well or get worse. Document Released: 03/14/2008 Document Revised: 03/26/2013 Document Reviewed: 01/02/2013 [facility] Patient Information 2015 [place], [place]. This information is not intended to replace advice given to you by your health care provider. Make sure you discuss any questions you have with your  health care provider. ° °

## 2014-12-02 ENCOUNTER — Other Ambulatory Visit: Payer: Self-pay | Admitting: Family Medicine

## 2014-12-02 DIAGNOSIS — Z90711 Acquired absence of uterus with remaining cervical stump: Secondary | ICD-10-CM

## 2014-12-02 DIAGNOSIS — R102 Pelvic and perineal pain: Secondary | ICD-10-CM

## 2014-12-02 DIAGNOSIS — R1900 Intra-abdominal and pelvic swelling, mass and lump, unspecified site: Secondary | ICD-10-CM

## 2014-12-14 ENCOUNTER — Ambulatory Visit
Admission: RE | Admit: 2014-12-14 | Discharge: 2014-12-14 | Disposition: A | Discharge status: Home / Self Care | Payer: 59 | Source: Ambulatory Visit | Attending: Family Medicine | Admitting: Family Medicine

## 2014-12-14 DIAGNOSIS — R1900 Intra-abdominal and pelvic swelling, mass and lump, unspecified site: Secondary | ICD-10-CM

## 2014-12-14 DIAGNOSIS — Z90711 Acquired absence of uterus with remaining cervical stump: Secondary | ICD-10-CM

## 2014-12-14 DIAGNOSIS — R102 Pelvic and perineal pain: Secondary | ICD-10-CM

## 2015-01-15 ENCOUNTER — Ambulatory Visit: Payer: Self-pay

## 2015-02-11 ENCOUNTER — Ambulatory Visit: Payer: Self-pay

## 2015-02-15 ENCOUNTER — Ambulatory Visit
Admission: RE | Admit: 2015-02-15 | Discharge: 2015-02-15 | Disposition: A | Discharge status: Home / Self Care | Payer: 59 | Source: Ambulatory Visit

## 2015-02-15 DIAGNOSIS — Z1231 Encounter for screening mammogram for malignant neoplasm of breast: Secondary | ICD-10-CM

## 2015-09-14 ENCOUNTER — Other Ambulatory Visit: Payer: Self-pay

## 2015-09-14 DIAGNOSIS — Z1231 Encounter for screening mammogram for malignant neoplasm of breast: Secondary | ICD-10-CM

## 2016-02-17 ENCOUNTER — Ambulatory Visit
Admission: RE | Admit: 2016-02-17 | Discharge: 2016-02-17 | Disposition: A | Discharge status: Home / Self Care | Payer: 59 | Source: Ambulatory Visit

## 2016-02-17 DIAGNOSIS — Z1231 Encounter for screening mammogram for malignant neoplasm of breast: Secondary | ICD-10-CM

## 2016-03-22 ENCOUNTER — Ambulatory Visit (INDEPENDENT_AMBULATORY_CARE_PROVIDER_SITE_OTHER): Payer: Self-pay | Admitting: Orthopaedic Surgery

## 2016-07-26 ENCOUNTER — Other Ambulatory Visit (INDEPENDENT_AMBULATORY_CARE_PROVIDER_SITE_OTHER): Payer: Self-pay | Admitting: Orthopaedic Surgery

## 2016-08-07 DIAGNOSIS — E039 Hypothyroidism, unspecified: Secondary | ICD-10-CM | POA: Diagnosis not present

## 2016-08-23 DIAGNOSIS — I10 Essential (primary) hypertension: Secondary | ICD-10-CM | POA: Diagnosis not present

## 2016-08-23 DIAGNOSIS — E039 Hypothyroidism, unspecified: Secondary | ICD-10-CM | POA: Diagnosis not present

## 2016-08-23 DIAGNOSIS — E7800 Pure hypercholesterolemia, unspecified: Secondary | ICD-10-CM | POA: Diagnosis not present

## 2016-09-06 DIAGNOSIS — Z01812 Encounter for preprocedural laboratory examination: Secondary | ICD-10-CM | POA: Diagnosis not present

## 2016-09-06 DIAGNOSIS — G5761 Lesion of plantar nerve, right lower limb: Secondary | ICD-10-CM | POA: Diagnosis not present

## 2016-09-06 DIAGNOSIS — M722 Plantar fascial fibromatosis: Secondary | ICD-10-CM | POA: Diagnosis not present

## 2016-09-06 DIAGNOSIS — Z79899 Other long term (current) drug therapy: Secondary | ICD-10-CM | POA: Diagnosis not present

## 2016-09-06 DIAGNOSIS — M2011 Hallux valgus (acquired), right foot: Secondary | ICD-10-CM | POA: Diagnosis not present

## 2016-09-19 DIAGNOSIS — M2011 Hallux valgus (acquired), right foot: Secondary | ICD-10-CM | POA: Diagnosis not present

## 2016-09-19 DIAGNOSIS — G5761 Lesion of plantar nerve, right lower limb: Secondary | ICD-10-CM | POA: Diagnosis not present

## 2016-09-19 DIAGNOSIS — M25571 Pain in right ankle and joints of right foot: Secondary | ICD-10-CM | POA: Diagnosis not present

## 2016-09-19 DIAGNOSIS — M21611 Bunion of right foot: Secondary | ICD-10-CM | POA: Diagnosis not present

## 2016-09-19 DIAGNOSIS — M79671 Pain in right foot: Secondary | ICD-10-CM | POA: Diagnosis not present

## 2016-09-22 DIAGNOSIS — M12271 Villonodular synovitis (pigmented), right ankle and foot: Secondary | ICD-10-CM | POA: Diagnosis not present

## 2016-10-02 DIAGNOSIS — I10 Essential (primary) hypertension: Secondary | ICD-10-CM | POA: Diagnosis not present

## 2016-10-04 DIAGNOSIS — G5761 Lesion of plantar nerve, right lower limb: Secondary | ICD-10-CM | POA: Diagnosis not present

## 2016-10-04 DIAGNOSIS — M12271 Villonodular synovitis (pigmented), right ankle and foot: Secondary | ICD-10-CM | POA: Diagnosis not present

## 2016-10-19 DIAGNOSIS — M12271 Villonodular synovitis (pigmented), right ankle and foot: Secondary | ICD-10-CM | POA: Diagnosis not present

## 2016-10-27 DIAGNOSIS — M25674 Stiffness of right foot, not elsewhere classified: Secondary | ICD-10-CM | POA: Diagnosis not present

## 2016-10-27 DIAGNOSIS — M25571 Pain in right ankle and joints of right foot: Secondary | ICD-10-CM | POA: Diagnosis not present

## 2016-10-27 DIAGNOSIS — M25671 Stiffness of right ankle, not elsewhere classified: Secondary | ICD-10-CM | POA: Diagnosis not present

## 2016-10-31 DIAGNOSIS — M25674 Stiffness of right foot, not elsewhere classified: Secondary | ICD-10-CM | POA: Diagnosis not present

## 2016-10-31 DIAGNOSIS — M25571 Pain in right ankle and joints of right foot: Secondary | ICD-10-CM | POA: Diagnosis not present

## 2016-10-31 DIAGNOSIS — M25671 Stiffness of right ankle, not elsewhere classified: Secondary | ICD-10-CM | POA: Diagnosis not present

## 2016-11-03 DIAGNOSIS — M25571 Pain in right ankle and joints of right foot: Secondary | ICD-10-CM | POA: Diagnosis not present

## 2016-11-03 DIAGNOSIS — M25674 Stiffness of right foot, not elsewhere classified: Secondary | ICD-10-CM | POA: Diagnosis not present

## 2016-11-03 DIAGNOSIS — M25671 Stiffness of right ankle, not elsewhere classified: Secondary | ICD-10-CM | POA: Diagnosis not present

## 2016-11-08 DIAGNOSIS — M25571 Pain in right ankle and joints of right foot: Secondary | ICD-10-CM | POA: Diagnosis not present

## 2016-11-08 DIAGNOSIS — M25671 Stiffness of right ankle, not elsewhere classified: Secondary | ICD-10-CM | POA: Diagnosis not present

## 2016-11-08 DIAGNOSIS — M25674 Stiffness of right foot, not elsewhere classified: Secondary | ICD-10-CM | POA: Diagnosis not present

## 2016-11-10 DIAGNOSIS — M25671 Stiffness of right ankle, not elsewhere classified: Secondary | ICD-10-CM | POA: Diagnosis not present

## 2016-11-10 DIAGNOSIS — M25674 Stiffness of right foot, not elsewhere classified: Secondary | ICD-10-CM | POA: Diagnosis not present

## 2016-11-10 DIAGNOSIS — M25571 Pain in right ankle and joints of right foot: Secondary | ICD-10-CM | POA: Diagnosis not present

## 2016-11-14 DIAGNOSIS — M25571 Pain in right ankle and joints of right foot: Secondary | ICD-10-CM | POA: Diagnosis not present

## 2016-11-14 DIAGNOSIS — M25671 Stiffness of right ankle, not elsewhere classified: Secondary | ICD-10-CM | POA: Diagnosis not present

## 2016-11-14 DIAGNOSIS — M25674 Stiffness of right foot, not elsewhere classified: Secondary | ICD-10-CM | POA: Diagnosis not present

## 2016-11-16 DIAGNOSIS — M25671 Stiffness of right ankle, not elsewhere classified: Secondary | ICD-10-CM | POA: Diagnosis not present

## 2016-11-16 DIAGNOSIS — M25674 Stiffness of right foot, not elsewhere classified: Secondary | ICD-10-CM | POA: Diagnosis not present

## 2016-11-16 DIAGNOSIS — M25571 Pain in right ankle and joints of right foot: Secondary | ICD-10-CM | POA: Diagnosis not present

## 2016-11-23 DIAGNOSIS — M25571 Pain in right ankle and joints of right foot: Secondary | ICD-10-CM | POA: Diagnosis not present

## 2016-11-23 DIAGNOSIS — M25671 Stiffness of right ankle, not elsewhere classified: Secondary | ICD-10-CM | POA: Diagnosis not present

## 2016-11-23 DIAGNOSIS — M25674 Stiffness of right foot, not elsewhere classified: Secondary | ICD-10-CM | POA: Diagnosis not present

## 2016-11-28 DIAGNOSIS — M25674 Stiffness of right foot, not elsewhere classified: Secondary | ICD-10-CM | POA: Diagnosis not present

## 2016-11-28 DIAGNOSIS — M25671 Stiffness of right ankle, not elsewhere classified: Secondary | ICD-10-CM | POA: Diagnosis not present

## 2016-11-28 DIAGNOSIS — M25571 Pain in right ankle and joints of right foot: Secondary | ICD-10-CM | POA: Diagnosis not present

## 2016-11-30 DIAGNOSIS — M25674 Stiffness of right foot, not elsewhere classified: Secondary | ICD-10-CM | POA: Diagnosis not present

## 2016-11-30 DIAGNOSIS — M25671 Stiffness of right ankle, not elsewhere classified: Secondary | ICD-10-CM | POA: Diagnosis not present

## 2016-11-30 DIAGNOSIS — M25571 Pain in right ankle and joints of right foot: Secondary | ICD-10-CM | POA: Diagnosis not present

## 2016-12-05 DIAGNOSIS — M25571 Pain in right ankle and joints of right foot: Secondary | ICD-10-CM | POA: Diagnosis not present

## 2016-12-05 DIAGNOSIS — M25671 Stiffness of right ankle, not elsewhere classified: Secondary | ICD-10-CM | POA: Diagnosis not present

## 2016-12-05 DIAGNOSIS — M25674 Stiffness of right foot, not elsewhere classified: Secondary | ICD-10-CM | POA: Diagnosis not present

## 2016-12-07 DIAGNOSIS — M25674 Stiffness of right foot, not elsewhere classified: Secondary | ICD-10-CM | POA: Diagnosis not present

## 2016-12-07 DIAGNOSIS — M25671 Stiffness of right ankle, not elsewhere classified: Secondary | ICD-10-CM | POA: Diagnosis not present

## 2016-12-07 DIAGNOSIS — M25571 Pain in right ankle and joints of right foot: Secondary | ICD-10-CM | POA: Diagnosis not present

## 2016-12-12 DIAGNOSIS — M25674 Stiffness of right foot, not elsewhere classified: Secondary | ICD-10-CM | POA: Diagnosis not present

## 2016-12-12 DIAGNOSIS — M25671 Stiffness of right ankle, not elsewhere classified: Secondary | ICD-10-CM | POA: Diagnosis not present

## 2016-12-12 DIAGNOSIS — M25571 Pain in right ankle and joints of right foot: Secondary | ICD-10-CM | POA: Diagnosis not present

## 2016-12-14 DIAGNOSIS — M25671 Stiffness of right ankle, not elsewhere classified: Secondary | ICD-10-CM | POA: Diagnosis not present

## 2016-12-14 DIAGNOSIS — M25674 Stiffness of right foot, not elsewhere classified: Secondary | ICD-10-CM | POA: Diagnosis not present

## 2016-12-14 DIAGNOSIS — M25571 Pain in right ankle and joints of right foot: Secondary | ICD-10-CM | POA: Diagnosis not present

## 2017-01-01 DIAGNOSIS — M12271 Villonodular synovitis (pigmented), right ankle and foot: Secondary | ICD-10-CM | POA: Diagnosis not present

## 2017-01-01 DIAGNOSIS — M25571 Pain in right ankle and joints of right foot: Secondary | ICD-10-CM | POA: Diagnosis not present

## 2017-01-05 ENCOUNTER — Other Ambulatory Visit: Payer: Self-pay | Admitting: Family Medicine

## 2017-01-11 ENCOUNTER — Other Ambulatory Visit: Payer: Self-pay | Admitting: Family Medicine

## 2017-01-11 DIAGNOSIS — Z1231 Encounter for screening mammogram for malignant neoplasm of breast: Secondary | ICD-10-CM

## 2017-01-31 ENCOUNTER — Ambulatory Visit
Admission: RE | Admit: 2017-01-31 | Discharge: 2017-01-31 | Disposition: A | Discharge status: Home / Self Care | Payer: 59 | Source: Ambulatory Visit | Attending: Family Medicine | Admitting: Family Medicine

## 2017-01-31 DIAGNOSIS — Z1231 Encounter for screening mammogram for malignant neoplasm of breast: Secondary | ICD-10-CM

## 2017-02-20 ENCOUNTER — Telehealth (INDEPENDENT_AMBULATORY_CARE_PROVIDER_SITE_OTHER): Payer: Self-pay

## 2017-02-20 NOTE — Telephone Encounter (Signed)
Pt LMVM for RC. I tried calling her back. No answer.

## 2017-02-22 DIAGNOSIS — R1311 Dysphagia, oral phase: Secondary | ICD-10-CM | POA: Diagnosis not present

## 2017-02-22 DIAGNOSIS — K219 Gastro-esophageal reflux disease without esophagitis: Secondary | ICD-10-CM | POA: Diagnosis not present

## 2017-03-05 ENCOUNTER — Ambulatory Visit (INDEPENDENT_AMBULATORY_CARE_PROVIDER_SITE_OTHER): Payer: 59 | Admitting: Orthopaedic Surgery

## 2017-03-05 ENCOUNTER — Ambulatory Visit (INDEPENDENT_AMBULATORY_CARE_PROVIDER_SITE_OTHER): Payer: 59

## 2017-03-05 DIAGNOSIS — M25561 Pain in right knee: Secondary | ICD-10-CM

## 2017-03-05 DIAGNOSIS — G8929 Other chronic pain: Secondary | ICD-10-CM | POA: Diagnosis not present

## 2017-03-05 MED ORDER — LIDOCAINE HCL 1 % IJ SOLN
3.0000 mL | INTRAMUSCULAR | Status: AC | PRN
  Administered 2017-03-05: 3 mL

## 2017-03-05 MED ORDER — METHYLPREDNISOLONE ACETATE 40 MG/ML IJ SUSP
40.0000 mg | INTRAMUSCULAR | Status: AC | PRN
  Administered 2017-03-05: 40 mg via INTRA_ARTICULAR

## 2017-03-05 NOTE — Progress Notes (Signed)
Office Visit Note   Patient: Nancy Lee           Date of Birth: 01-17-1956           MRN: [id number] Visit Date: 03/05/2017              Requested by: Kelton Pillar, MD [address] [place], [place] [address] PCP: Kelton Pillar, MD   Assessment & Plan: Visit Diagnoses:  1. Chronic pain of right knee     Plan: X-rays and clinical exam involving the right knee show no glaring deformities. Offered her steroid injection to see if this would calm down her daily pain and she agreed with this. She tolerated the injection well. She'll follow-up as needed. All questions were encouraged and answered.  Follow-Up Instructions: Return if symptoms worsen or fail to improve.   Orders:  Orders Placed This Encounter  Procedures  . Large Joint Injection/Arthrocentesis  . XR Knee 1-2 Views Right   No orders of the defined types were placed in this encounter.     Procedures: Large Joint Inj Date/Time: 03/05/2017 10:09 AM Performed by: Mcarthur Rossetti Authorized by: Mcarthur Rossetti   Location:  Knee Site:  R knee Ultrasound Guidance: No   Fluoroscopic Guidance: No   Arthrogram: No   Medications:  3 mL lidocaine 1 %; 40 mg methylPREDNISolone acetate 40 MG/ML     Clinical Data: No additional findings.   Subjective: No chief complaint on file. The patient is someone have not seen in a while. She comes in with a chief complaint of right knee pain. She says the knee is really flared up on her after having foot surgery in April of this year on her right foot. This caused her to walk different and now she has pain is waking her up at night. She denies any locking catching in the right knee he denies any swelling. She says is just a global type of pain.  HPI  Review of Systems She currently denies any headache, chest pain, shortness of breath, fever, chills, nausea, vomiting.  Objective: Vital Signs: There were no vitals taken for this  visit.  Physical Exam She is alert or 3 and in no acute distress Ortho Exam Examination of her right knee shows no effusion. Her range of motion is full. The knee feels ligaments stable. Specialty Comments:  No specialty comments available.  Imaging: Xr Knee 1-2 Views Right  Result Date: 03/05/2017 An AP and lateral of the right knee show no acute findings. There is only mild arthritic changes of the lateral compartment but the overall alignment is well maintained    PMFS History: Patient Active Problem List   Diagnosis Date Noted  . Chronic pain of right knee 03/05/2017   Past Medical History:  Diagnosis Date  . Allergy   . GERD (gastroesophageal reflux disease)   . Hyperlipidemia     Family History  Problem Relation Age of Onset  . Hypertension Mother   . Alcohol abuse Father   . Coronary artery disease Unknown     Past Surgical History:  Procedure Laterality Date  . ABDOMINAL HYSTERECTOMY  2000   fibroids  . BUNIONECTOMY    . CHOLECYSTECTOMY  2007   gallstones  . DILATION AND CURETTAGE OF UTERUS    . DILATION AND CURETTE    . THYROIDECTOMY  05/02/2011   Procedure: THYROIDECTOMY;  Surgeon: Joyice Faster. Cornett, MD;  Location: [facility];  Service: General;  Laterality: Bilateral;  .  TONSILLECTOMY     as a child  . TONSILLECTOMY AND ADENOIDECTOMY    . TOTAL THYROIDECTOMY  05/02/2011   Social History   Occupational History  . Not on file.   Social History Main Topics  . Smoking status: Former Smoker    Packs/day: 0.25    Years: 2.00    Types: Cigarettes    Quit date: 04/26/2007  . Smokeless tobacco: Not on file  . Alcohol use 1.2 oz/week    2 Glasses of wine per week  . Drug use: No  . Sexual activity: Yes    Birth control/ protection: Post-menopausal

## 2017-03-20 DIAGNOSIS — E7800 Pure hypercholesterolemia, unspecified: Secondary | ICD-10-CM | POA: Diagnosis not present

## 2017-03-20 DIAGNOSIS — Z23 Encounter for immunization: Secondary | ICD-10-CM | POA: Diagnosis not present

## 2017-03-20 DIAGNOSIS — Z0000 Encounter for general adult medical examination without abnormal findings: Secondary | ICD-10-CM | POA: Diagnosis not present

## 2017-03-20 DIAGNOSIS — E039 Hypothyroidism, unspecified: Secondary | ICD-10-CM | POA: Diagnosis not present

## 2017-04-02 DIAGNOSIS — M792 Neuralgia and neuritis, unspecified: Secondary | ICD-10-CM | POA: Diagnosis not present

## 2017-04-02 DIAGNOSIS — T8484XD Pain due to internal orthopedic prosthetic devices, implants and grafts, subsequent encounter: Secondary | ICD-10-CM | POA: Diagnosis not present

## 2017-04-02 DIAGNOSIS — M12271 Villonodular synovitis (pigmented), right ankle and foot: Secondary | ICD-10-CM | POA: Diagnosis not present

## 2017-04-18 DIAGNOSIS — M792 Neuralgia and neuritis, unspecified: Secondary | ICD-10-CM | POA: Diagnosis not present

## 2017-04-27 DIAGNOSIS — M792 Neuralgia and neuritis, unspecified: Secondary | ICD-10-CM | POA: Diagnosis not present

## 2017-05-17 ENCOUNTER — Other Ambulatory Visit: Payer: Self-pay | Admitting: Family Medicine

## 2017-05-17 DIAGNOSIS — L858 Other specified epidermal thickening: Secondary | ICD-10-CM | POA: Diagnosis not present

## 2017-05-17 DIAGNOSIS — M8588 Other specified disorders of bone density and structure, other site: Secondary | ICD-10-CM | POA: Diagnosis not present

## 2017-05-23 DIAGNOSIS — M12271 Villonodular synovitis (pigmented), right ankle and foot: Secondary | ICD-10-CM | POA: Diagnosis not present

## 2017-05-23 DIAGNOSIS — Z79899 Other long term (current) drug therapy: Secondary | ICD-10-CM | POA: Diagnosis not present

## 2017-05-23 DIAGNOSIS — Z01812 Encounter for preprocedural laboratory examination: Secondary | ICD-10-CM | POA: Diagnosis not present

## 2017-06-05 DIAGNOSIS — T8484XD Pain due to internal orthopedic prosthetic devices, implants and grafts, subsequent encounter: Secondary | ICD-10-CM | POA: Diagnosis not present

## 2017-06-05 DIAGNOSIS — T8484XA Pain due to internal orthopedic prosthetic devices, implants and grafts, initial encounter: Secondary | ICD-10-CM | POA: Diagnosis not present

## 2017-06-08 DIAGNOSIS — M12271 Villonodular synovitis (pigmented), right ankle and foot: Secondary | ICD-10-CM | POA: Diagnosis not present

## 2017-06-21 DIAGNOSIS — M12271 Villonodular synovitis (pigmented), right ankle and foot: Secondary | ICD-10-CM | POA: Diagnosis not present

## 2017-06-25 DIAGNOSIS — K219 Gastro-esophageal reflux disease without esophagitis: Secondary | ICD-10-CM | POA: Diagnosis not present

## 2017-07-11 ENCOUNTER — Encounter (INDEPENDENT_AMBULATORY_CARE_PROVIDER_SITE_OTHER): Payer: Self-pay | Admitting: Orthopaedic Surgery

## 2017-07-11 ENCOUNTER — Ambulatory Visit (INDEPENDENT_AMBULATORY_CARE_PROVIDER_SITE_OTHER): Payer: 59 | Admitting: Orthopaedic Surgery

## 2017-07-11 DIAGNOSIS — G8929 Other chronic pain: Secondary | ICD-10-CM | POA: Diagnosis not present

## 2017-07-11 DIAGNOSIS — M25561 Pain in right knee: Secondary | ICD-10-CM

## 2017-07-11 MED ORDER — METHYLPREDNISOLONE ACETATE 40 MG/ML IJ SUSP
40.0000 mg | INTRAMUSCULAR | Status: AC | PRN
  Administered 2017-07-11: 40 mg via INTRA_ARTICULAR

## 2017-07-11 MED ORDER — LIDOCAINE HCL 1 % IJ SOLN
3.0000 mL | INTRAMUSCULAR | Status: AC | PRN
  Administered 2017-07-11: 3 mL

## 2017-07-11 NOTE — Progress Notes (Signed)
   Procedure Note  Patient: Nancy Lee             Date of Birth: 06/02/1956           MRN: [id number]             Visit Date: 07/11/2017  Procedures: Visit Diagnoses: Chronic pain of right knee  Large Joint Inj: R knee on 07/11/2017 3:13 PM Indications: diagnostic evaluation and pain Details: 22 G 1.5 in needle, superolateral approach  Arthrogram: No  Medications: 3 mL lidocaine 1 %; 40 mg methylPREDNISolone acetate 40 MG/ML Outcome: tolerated well, no immediate complications Procedure, treatment alternatives, risks and benefits explained, specific risks discussed. Consent was given by the patient. Immediately prior to procedure a time out was called to verify the correct patient, procedure, equipment, support staff and site/side marked as required. Patient was prepped and draped in the usual sterile fashion.

## 2017-07-11 NOTE — Progress Notes (Signed)
The patient is coming for follow-up again with right knee pain.  Her knee is been aching quite a bit.  We had her ankles the past that showed moderate arthritic changes.  She has had recurrent effusions she states in the knee hurts all day long.  We have tried anti-inflammatories and steroid injection in the past.  Is been at least 4 months since her last steroid injection she like to have one today.  On examination I do not since there is a fusion from the knee arthrotomy fluid off it was not successful.  I placed a steroid injection in the knee.  Due to continued pain and medial joint line tenderness with what she says recurrent effusions we will obtain an MRI without any type of internal derangement that is causing her the pain and swelling she is been having.  We will see her back in 2 weeks

## 2017-07-12 ENCOUNTER — Other Ambulatory Visit (INDEPENDENT_AMBULATORY_CARE_PROVIDER_SITE_OTHER): Payer: Self-pay

## 2017-07-12 DIAGNOSIS — M25561 Pain in right knee: Principal | ICD-10-CM

## 2017-07-12 DIAGNOSIS — G8929 Other chronic pain: Secondary | ICD-10-CM

## 2017-07-25 ENCOUNTER — Ambulatory Visit (INDEPENDENT_AMBULATORY_CARE_PROVIDER_SITE_OTHER): Payer: 59 | Admitting: Orthopaedic Surgery

## 2017-07-25 ENCOUNTER — Encounter (INDEPENDENT_AMBULATORY_CARE_PROVIDER_SITE_OTHER): Payer: Self-pay | Admitting: Orthopaedic Surgery

## 2017-07-25 ENCOUNTER — Telehealth (INDEPENDENT_AMBULATORY_CARE_PROVIDER_SITE_OTHER): Payer: Self-pay

## 2017-07-25 DIAGNOSIS — G8929 Other chronic pain: Secondary | ICD-10-CM | POA: Diagnosis not present

## 2017-07-25 DIAGNOSIS — M25561 Pain in right knee: Secondary | ICD-10-CM | POA: Diagnosis not present

## 2017-07-25 NOTE — Progress Notes (Signed)
The patient is continue to follow-up with right knee pain with some locking and catching.  It was quite acutely of the week and we obtained plain films that were unremarkable in terms of any acute changes.  There is some mild arthritic changes.  I did place a steroid injection in her knee and she is gotten pretty good relief from this but she still having pain with pivoting activities and locking catching in her right knee.  She is worked on activity modification as well as anti-inflammatories and quad strengthening exercises and this is not help.  On exam she has significant medial joint line tenderness and a positive MRSA on the medial side of her knee with good range of motion.  At this point given the failure of rest, ice, heat, anti-inflammatories, quad strengthening exercises and a steroid injection and MRI is warranted to rule out a meniscal tear of her right knee.  All questions concerns were addressed.  We will order the MRI and then see her back to go over this in 2 weeks.

## 2017-07-25 NOTE — Telephone Encounter (Signed)
Patient came today for "followup" appointment for her MRI, it's in there and looks approved-can you tell me where it is and the hold up? She'll be back again in 2 weeks for another FU

## 2017-07-26 NOTE — Telephone Encounter (Signed)
appt made and pt aare

## 2017-08-01 ENCOUNTER — Ambulatory Visit (HOSPITAL_COMMUNITY): Payer: Self-pay

## 2017-08-03 ENCOUNTER — Ambulatory Visit (HOSPITAL_COMMUNITY): Payer: Self-pay

## 2017-08-03 ENCOUNTER — Other Ambulatory Visit (INDEPENDENT_AMBULATORY_CARE_PROVIDER_SITE_OTHER): Payer: Self-pay | Admitting: Orthopaedic Surgery

## 2017-08-03 ENCOUNTER — Ambulatory Visit (HOSPITAL_COMMUNITY)
Admission: RE | Admit: 2017-08-03 | Discharge: 2017-08-03 | Disposition: A | Discharge status: Home / Self Care | Payer: 59 | Source: Ambulatory Visit | Attending: Orthopaedic Surgery | Admitting: Orthopaedic Surgery

## 2017-08-03 DIAGNOSIS — S83011A Lateral subluxation of right patella, initial encounter: Secondary | ICD-10-CM | POA: Diagnosis not present

## 2017-08-03 DIAGNOSIS — S83271A Complex tear of lateral meniscus, current injury, right knee, initial encounter: Secondary | ICD-10-CM | POA: Insufficient documentation

## 2017-08-03 DIAGNOSIS — M1711 Unilateral primary osteoarthritis, right knee: Secondary | ICD-10-CM | POA: Diagnosis not present

## 2017-08-03 DIAGNOSIS — M25861 Other specified joint disorders, right knee: Secondary | ICD-10-CM | POA: Diagnosis not present

## 2017-08-03 DIAGNOSIS — G8929 Other chronic pain: Secondary | ICD-10-CM | POA: Diagnosis not present

## 2017-08-03 DIAGNOSIS — M25561 Pain in right knee: Secondary | ICD-10-CM | POA: Insufficient documentation

## 2017-08-08 ENCOUNTER — Ambulatory Visit (INDEPENDENT_AMBULATORY_CARE_PROVIDER_SITE_OTHER): Payer: 59 | Admitting: Orthopaedic Surgery

## 2017-08-08 ENCOUNTER — Encounter (INDEPENDENT_AMBULATORY_CARE_PROVIDER_SITE_OTHER): Payer: Self-pay | Admitting: Orthopaedic Surgery

## 2017-08-08 DIAGNOSIS — M23200 Derangement of unspecified lateral meniscus due to old tear or injury, right knee: Secondary | ICD-10-CM

## 2017-08-08 NOTE — Progress Notes (Signed)
The patient is following up to go over an MRI of her right knee.  She has chronic pain in that knee and injections only provide a little bit of relief.  She works on her feet all day long and is at pain mainly on the lateral aspect of her knee.  X-rays did show some degenerative changes in the lateral compartment.  On exam she does still hurt along the lateral joint line with no effusion.  She does have a positive Murray sign to the lateral compartment as well as her right knee.  MRI is reviewed with her and it does show a chronic large lateral meniscal tear with para meniscal cyst and cartilage thinning of the lateral compartment of her knee.  I went over knee model explained in detail what her MRI shows.  I have recommended arthroscopic intervention.  She says she will think about this and may just try inserts in her shoes for now just over-the-counter.  All questions concerns were answered and addressed.  She decides to have the surgery she will let us know.  I explained in detail what orthoscopic surgery involves as well including her intraoperative and postoperative course and discussion the risk and benefits of surgery.

## 2017-09-18 DIAGNOSIS — I10 Essential (primary) hypertension: Secondary | ICD-10-CM | POA: Diagnosis not present

## 2017-09-18 DIAGNOSIS — E039 Hypothyroidism, unspecified: Secondary | ICD-10-CM | POA: Diagnosis not present

## 2017-09-18 DIAGNOSIS — E7800 Pure hypercholesterolemia, unspecified: Secondary | ICD-10-CM | POA: Diagnosis not present

## 2017-09-19 DIAGNOSIS — Z124 Encounter for screening for malignant neoplasm of cervix: Secondary | ICD-10-CM | POA: Diagnosis not present

## 2017-09-19 DIAGNOSIS — Z780 Asymptomatic menopausal state: Secondary | ICD-10-CM | POA: Diagnosis not present

## 2017-09-19 DIAGNOSIS — Z01419 Encounter for gynecological examination (general) (routine) without abnormal findings: Secondary | ICD-10-CM | POA: Diagnosis not present

## 2017-10-08 DIAGNOSIS — J301 Allergic rhinitis due to pollen: Secondary | ICD-10-CM | POA: Diagnosis not present

## 2017-10-08 DIAGNOSIS — J3089 Other allergic rhinitis: Secondary | ICD-10-CM | POA: Diagnosis not present

## 2017-10-08 DIAGNOSIS — Z91018 Allergy to other foods: Secondary | ICD-10-CM | POA: Diagnosis not present

## 2017-10-11 ENCOUNTER — Other Ambulatory Visit (INDEPENDENT_AMBULATORY_CARE_PROVIDER_SITE_OTHER): Payer: Self-pay | Admitting: Orthopaedic Surgery

## 2017-11-01 ENCOUNTER — Encounter (INDEPENDENT_AMBULATORY_CARE_PROVIDER_SITE_OTHER): Payer: Self-pay | Admitting: Orthopaedic Surgery

## 2017-11-01 ENCOUNTER — Ambulatory Visit (INDEPENDENT_AMBULATORY_CARE_PROVIDER_SITE_OTHER): Payer: 59 | Admitting: Orthopaedic Surgery

## 2017-11-01 DIAGNOSIS — G8929 Other chronic pain: Secondary | ICD-10-CM | POA: Diagnosis not present

## 2017-11-01 DIAGNOSIS — M25561 Pain in right knee: Secondary | ICD-10-CM

## 2017-11-01 MED ORDER — LIDOCAINE HCL 1 % IJ SOLN
3.0000 mL | INTRAMUSCULAR | Status: AC | PRN
  Administered 2017-11-01: 3 mL

## 2017-11-01 MED ORDER — METHYLPREDNISOLONE ACETATE 40 MG/ML IJ SUSP
40.0000 mg | INTRAMUSCULAR | Status: AC | PRN
  Administered 2017-11-01: 40 mg via INTRA_ARTICULAR

## 2017-11-01 NOTE — Progress Notes (Signed)
Office Visit Note   Patient: Nancy Lee           Date of Birth: Nov 05, 1955           MRN: [id number] Visit Date: 11/01/2017              Requested by: Kelton Pillar, MD [place] [address] [employer] [address] [place], [place] [address] PCP: Kelton Pillar, MD   Assessment & Plan: Visit Diagnoses:  1. Chronic pain of right knee     Plan: I do feel that she would benefit from her arthroscopic intervention of her right knee given the MRI findings and her clinical exam findings.  I agree with still staying conservative though at least with a steroid injection.  I told her that if she decides to have an arthroscopic surgery of her knee that we have her out of work from as little as 1 week up to 4 weeks depending on her recovery how she is doing overall.  I explained in detail what the surgery involves including discussion the risk and benefits of surgery.  She is stable to think about this so I gave her surgery schedulers card.  Follow-Up Instructions: No follow-ups on file.   Orders:  No orders of the defined types were placed in this encounter.  No orders of the defined types were placed in this encounter.     Procedures: Large Joint Inj: R knee on 11/01/2017 3:01 PM Indications: diagnostic evaluation and pain Details: 22 G 1.5 in needle, superolateral approach  Arthrogram: No  Medications: 3 mL lidocaine 1 %; 40 mg methylPREDNISolone acetate 40 MG/ML Outcome: tolerated well, no immediate complications Procedure, treatment alternatives, risks and benefits explained, specific risks discussed. Consent was given by the patient. Immediately prior to procedure a time out was called to verify the correct patient, procedure, equipment, support staff and site/side marked as required. Patient was prepped and draped in the usual sterile fashion.       Clinical Data: No additional findings.   Subjective: Chief Complaint  Patient presents with  . Right Knee - Follow-up  The patient is  following up today for her right knee.  She has a known significant lateral meniscal tear of that right knee and I have recommended arthroscopic intervention.  She still uncertain if she wants to have this done just yet.  She has questions about how long she will be out of work and appropriate questions about her knee in general.  She does wish to have a steroid injection today though because it can help with her pain she states and can help her with her standing on her feet all day long.  She does take anti-inflammatories and that helps take the edge off of things.  She still has some locking catching in her knee as well.  She currently denies any headache, chest pain, shortness of breath, fever, chills, nausea or vomiting.  HPI  Review of Systems See above  Objective: Vital Signs: There were no vitals taken for this visit.  Physical Exam She is alert and oriented x3 and in no acute distress  Ortho Exam  Does show global tenderness.  There is a slight effusion.  Most of her pain seems to be lateral compartment with a positive worse on the lateral compartment. Specialty Comments:  No specialty comments available.  Imaging: No results found.   PMFS History: Patient Active Problem List   Diagnosis Date Noted  . Old complex tear of lateral meniscus of right  knee 08/08/2017  . Chronic pain of right knee 03/05/2017   Past Medical History:  Diagnosis Date  . Allergy   . GERD (gastroesophageal reflux disease)   . Hyperlipidemia     Family History  Problem Relation Age of Onset  . Hypertension Mother   . Alcohol abuse Father   . Coronary artery disease Unknown     Past Surgical History:  Procedure Laterality Date  . ABDOMINAL HYSTERECTOMY  2000   fibroids  . BUNIONECTOMY    . CHOLECYSTECTOMY  2007   gallstones  . DILATION AND CURETTAGE OF UTERUS    . DILATION AND CURETTE    . THYROIDECTOMY  05/02/2011   Procedure: THYROIDECTOMY;  Surgeon: Joyice Faster. Cornett, MD;  Location: [facility];  Service: General;  Laterality: Bilateral;  . TONSILLECTOMY     as a child  . TONSILLECTOMY AND ADENOIDECTOMY    . TOTAL THYROIDECTOMY  05/02/2011   Social History   Occupational History  . Not on file  Tobacco Use  . Smoking status: Former Smoker    Packs/day: 0.25    Years: 2.00    Pack years: 0.50    Types: Cigarettes    Last attempt to quit: 04/26/2007    Years since quitting: 10.5  Substance and Sexual Activity  . Alcohol use: Yes    Alcohol/week: 1.2 oz    Types: 2 Glasses of wine per week  . Drug use: No  . Sexual activity: Yes    Birth control/protection: Post-menopausal

## 2017-11-09 DIAGNOSIS — J069 Acute upper respiratory infection, unspecified: Secondary | ICD-10-CM | POA: Diagnosis not present

## 2017-12-05 ENCOUNTER — Telehealth (INDEPENDENT_AMBULATORY_CARE_PROVIDER_SITE_OTHER): Payer: Self-pay | Admitting: *Deleted

## 2017-12-05 NOTE — Telephone Encounter (Signed)
Pt left vm on Triage stating she would like to schedule surgery for her Right knee, states that Dr. Ninfa Linden told her when she was ready to call surgery schedule to get appt. Pt is expecting call back   CB [phone]

## 2017-12-06 NOTE — Telephone Encounter (Signed)
Please call patient to schedule.

## 2017-12-07 NOTE — Telephone Encounter (Signed)
I spoke with Ms. Nancy Lee. We scheduled surgery for 01/31/2018, per her request.  All surgery info given.

## 2017-12-15 ENCOUNTER — Other Ambulatory Visit (INDEPENDENT_AMBULATORY_CARE_PROVIDER_SITE_OTHER): Payer: Self-pay | Admitting: Orthopaedic Surgery

## 2017-12-21 ENCOUNTER — Other Ambulatory Visit: Payer: Self-pay | Admitting: Family Medicine

## 2017-12-21 DIAGNOSIS — Z1231 Encounter for screening mammogram for malignant neoplasm of breast: Secondary | ICD-10-CM

## 2018-01-31 DIAGNOSIS — S83281A Other tear of lateral meniscus, current injury, right knee, initial encounter: Secondary | ICD-10-CM | POA: Diagnosis not present

## 2018-01-31 DIAGNOSIS — M948X6 Other specified disorders of cartilage, lower leg: Secondary | ICD-10-CM | POA: Diagnosis not present

## 2018-01-31 DIAGNOSIS — M23261 Derangement of other lateral meniscus due to old tear or injury, right knee: Secondary | ICD-10-CM | POA: Diagnosis not present

## 2018-01-31 DIAGNOSIS — G8918 Other acute postprocedural pain: Secondary | ICD-10-CM | POA: Diagnosis not present

## 2018-02-11 ENCOUNTER — Other Ambulatory Visit (INDEPENDENT_AMBULATORY_CARE_PROVIDER_SITE_OTHER): Payer: Self-pay | Admitting: Orthopaedic Surgery

## 2018-02-13 ENCOUNTER — Ambulatory Visit (INDEPENDENT_AMBULATORY_CARE_PROVIDER_SITE_OTHER): Payer: 59 | Admitting: Orthopaedic Surgery

## 2018-02-13 ENCOUNTER — Encounter (INDEPENDENT_AMBULATORY_CARE_PROVIDER_SITE_OTHER): Payer: Self-pay | Admitting: Orthopaedic Surgery

## 2018-02-13 DIAGNOSIS — Z9889 Other specified postprocedural states: Secondary | ICD-10-CM | POA: Insufficient documentation

## 2018-02-13 DIAGNOSIS — M1711 Unilateral primary osteoarthritis, right knee: Secondary | ICD-10-CM

## 2018-02-13 NOTE — Progress Notes (Signed)
The patient is returning for follow-up 1 week after a right knee arthroscopy.  We found a large medial meniscal tear and wearing down of cartilage in the knee.  There were some areas of full-thickness cartilage loss.  She does report some swelling in her knee and she is been using her cane but does report minimal pain has been doing well overall.  I went over arthroscopy pictures with her to assure the extent of what we did in her knee.  She is got good range of motion overall on exam with a mild effusion.  Her suture sites look good to remove the sutures.  At this point we will work on aggressive quad strengthening of her knee.  I do feel that she is a candidate for hyaluronic acid.  I will see her back in 4 weeks to place a gel shot in her right knee.  All question concerns were answered and addressed.

## 2018-02-14 ENCOUNTER — Telehealth (INDEPENDENT_AMBULATORY_CARE_PROVIDER_SITE_OTHER): Payer: Self-pay | Admitting: Orthopaedic Surgery

## 2018-02-14 NOTE — Telephone Encounter (Signed)
Patient called to state that she needs a new note to extended her to return to work on 9/16. The note she has states return on 9/12 &9/13.  She wants to speak to someone in regard to note written  Please call [phone]

## 2018-02-15 ENCOUNTER — Telehealth (INDEPENDENT_AMBULATORY_CARE_PROVIDER_SITE_OTHER): Payer: Self-pay

## 2018-02-15 ENCOUNTER — Encounter (INDEPENDENT_AMBULATORY_CARE_PROVIDER_SITE_OTHER): Payer: Self-pay

## 2018-02-15 NOTE — Telephone Encounter (Signed)
Patient aware note at front desk  

## 2018-02-15 NOTE — Telephone Encounter (Signed)
Right knee gel injection  

## 2018-02-20 NOTE — Telephone Encounter (Signed)
Noted  

## 2018-02-22 ENCOUNTER — Ambulatory Visit
Admission: RE | Admit: 2018-02-22 | Discharge: 2018-02-22 | Disposition: A | Discharge status: Home / Self Care | Payer: 59 | Source: Ambulatory Visit | Attending: Family Medicine | Admitting: Family Medicine

## 2018-02-22 ENCOUNTER — Telehealth (INDEPENDENT_AMBULATORY_CARE_PROVIDER_SITE_OTHER): Payer: Self-pay

## 2018-02-22 DIAGNOSIS — Z1231 Encounter for screening mammogram for malignant neoplasm of breast: Secondary | ICD-10-CM | POA: Diagnosis not present

## 2018-02-22 NOTE — Telephone Encounter (Signed)
Submitted VOB for Monovisc, right knee. 

## 2018-03-06 DIAGNOSIS — Z23 Encounter for immunization: Secondary | ICD-10-CM | POA: Diagnosis not present

## 2018-03-12 ENCOUNTER — Telehealth (INDEPENDENT_AMBULATORY_CARE_PROVIDER_SITE_OTHER): Payer: Self-pay

## 2018-03-12 NOTE — Telephone Encounter (Signed)
Patient is approved for SynviscOne, right knee. Buy & Bill Covered at 100% after co-pay. Co-pay $80.00 No PA required  Appt.schedueld 03/13/2018.

## 2018-03-13 ENCOUNTER — Encounter (INDEPENDENT_AMBULATORY_CARE_PROVIDER_SITE_OTHER): Payer: Self-pay | Admitting: Orthopaedic Surgery

## 2018-03-13 ENCOUNTER — Ambulatory Visit (INDEPENDENT_AMBULATORY_CARE_PROVIDER_SITE_OTHER): Payer: 59 | Admitting: Orthopaedic Surgery

## 2018-03-13 DIAGNOSIS — M1711 Unilateral primary osteoarthritis, right knee: Secondary | ICD-10-CM | POA: Diagnosis not present

## 2018-03-13 MED ORDER — HYLAN G-F 20 48 MG/6ML IX SOSY
48.0000 mg | PREFILLED_SYRINGE | INTRA_ARTICULAR | Status: AC | PRN
  Administered 2018-03-13: 48 mg via INTRA_ARTICULAR

## 2018-03-13 NOTE — Progress Notes (Signed)
   Procedure Note  Patient: Nancy Lee             Date of Birth: 02/01/1956           MRN: [id number]             Visit Date: 03/13/2018  Procedures: Visit Diagnoses: Unilateral primary osteoarthritis, right knee  Large Joint Inj on 03/13/2018 9:34 AM Indications: pain and diagnostic evaluation Details: 22 G 1.5 in needle, superolateral approach  Arthrogram: No  Medications: 48 mg Hylan 48 MG/6ML Outcome: tolerated well, no immediate complications Procedure, treatment alternatives, risks and benefits explained, specific risks discussed. Consent was given by the patient. Immediately prior to procedure a time out was called to verify the correct patient, procedure, equipment, support staff and site/side marked as required. Patient was prepped and draped in the usual sterile fashion.     The patient is here today for scheduled hyaluronic acid injection with Synvisc 1 into her right knee.  This is to treat the pain from known osteoarthritis of that knee.  She is also in her postoperative period from having a knee arthroscopy in August of this year.  She is doing well overall.  The knee does swell and get stiff on her.  She is not interested in any type of knee replacement surgery in hopes of these types of injections will temporize her symptoms.  We agree with this as well.  Exam she only has a slight effusion with the right knee with full range of motion.  She tolerated the Synvisc 1 injection well.  All question concerns were answered and addressed.  She understands the rationale by these injections.  She also notes that she can get a steroid injection down the road if needed.  Follow-up will be as needed otherwise.

## 2018-04-22 DIAGNOSIS — M859 Disorder of bone density and structure, unspecified: Secondary | ICD-10-CM | POA: Diagnosis not present

## 2018-04-22 DIAGNOSIS — Z1159 Encounter for screening for other viral diseases: Secondary | ICD-10-CM | POA: Diagnosis not present

## 2018-04-22 DIAGNOSIS — Z23 Encounter for immunization: Secondary | ICD-10-CM | POA: Diagnosis not present

## 2018-04-22 DIAGNOSIS — E7800 Pure hypercholesterolemia, unspecified: Secondary | ICD-10-CM | POA: Diagnosis not present

## 2018-05-06 ENCOUNTER — Ambulatory Visit (INDEPENDENT_AMBULATORY_CARE_PROVIDER_SITE_OTHER): Payer: 59 | Admitting: Orthopaedic Surgery

## 2018-05-06 ENCOUNTER — Encounter (INDEPENDENT_AMBULATORY_CARE_PROVIDER_SITE_OTHER): Payer: Self-pay | Admitting: Orthopaedic Surgery

## 2018-05-06 DIAGNOSIS — Z9889 Other specified postprocedural states: Secondary | ICD-10-CM

## 2018-05-06 DIAGNOSIS — M1711 Unilateral primary osteoarthritis, right knee: Secondary | ICD-10-CM | POA: Diagnosis not present

## 2018-05-06 MED ORDER — HYDROCODONE-ACETAMINOPHEN 5-325 MG PO TABS: 1.0000 | ORAL_TABLET | Freq: Four times a day (QID) | ORAL | 0 refills | Status: DC | PRN

## 2018-05-06 NOTE — Progress Notes (Signed)
The patient continued to have significant pain in her right knee.  We performed arthroscopic intervention earlier this year on the right knee and found areas of full-thickness cartilage loss however over day.  Is then we have tried anti-inflammatories and activity modification as well as quad strengthening exercises.  In September she had a hyaluronic acid injection and so far has not gotten any relief from that injection.  That was on September 25 so is still not been 8 weeks yet.  On exam her right knee is painful medial joint line with no significant effusion.  At this point I did go over arthroscopy pictures again show the extent of her arthritis.  The only thing we could recommend is knee replacement surgery due to her debilitating pain.  She is requesting hydrocodone but I told her I do not refill it one more time we will not keep her on hydrocodone for the pain from arthritis.  I talked her about knee replacement surgery and she was to defer this to at least sometime a year from now.  We can always provide a steroid injection in her knee in late winter if he gets bad enough for her.  All question concerns were answered and addressed.  Follow-up will otherwise be as needed.  Again I told her I would not provide any more narcotics for this chronic issue either.

## 2018-07-08 DIAGNOSIS — Z23 Encounter for immunization: Secondary | ICD-10-CM | POA: Diagnosis not present

## 2018-08-13 ENCOUNTER — Telehealth (INDEPENDENT_AMBULATORY_CARE_PROVIDER_SITE_OTHER): Payer: Self-pay | Admitting: Orthopaedic Surgery

## 2018-08-13 NOTE — Telephone Encounter (Signed)
Patient called asked if she can get the Gel injection for her right knee sometime in March? The number to contact patient is [phone]

## 2018-08-13 NOTE — Telephone Encounter (Signed)
Can you see if we can get patient authorized-CB/GC patient

## 2018-08-16 NOTE — Telephone Encounter (Signed)
Noted  

## 2018-08-20 ENCOUNTER — Telehealth (INDEPENDENT_AMBULATORY_CARE_PROVIDER_SITE_OTHER): Payer: Self-pay

## 2018-08-20 NOTE — Telephone Encounter (Signed)
Talked with patient and advised her that she is approved for gel injection.  Approved for Durolane, right knee. Buy & Bill Covered at 100% of the allowable amount. Co-pay of $50.00 (required) No PA required  Appt. 09/12/2018 with Dr. Ninfa Linden

## 2018-08-20 NOTE — Telephone Encounter (Signed)
Submitted VOB for Durolane, right knee. 

## 2018-08-26 ENCOUNTER — Other Ambulatory Visit (INDEPENDENT_AMBULATORY_CARE_PROVIDER_SITE_OTHER): Payer: Self-pay | Admitting: Orthopaedic Surgery

## 2018-09-10 ENCOUNTER — Telehealth (INDEPENDENT_AMBULATORY_CARE_PROVIDER_SITE_OTHER): Payer: Self-pay | Admitting: Radiology

## 2018-09-10 NOTE — Telephone Encounter (Signed)
lmom at both numbers for her to call back and move appt to an earlier time that day or to another day.

## 2018-09-12 ENCOUNTER — Encounter (INDEPENDENT_AMBULATORY_CARE_PROVIDER_SITE_OTHER): Payer: Self-pay | Admitting: Orthopaedic Surgery

## 2018-09-12 ENCOUNTER — Ambulatory Visit (INDEPENDENT_AMBULATORY_CARE_PROVIDER_SITE_OTHER): Payer: 59 | Admitting: Orthopaedic Surgery

## 2018-09-12 ENCOUNTER — Ambulatory Visit (INDEPENDENT_AMBULATORY_CARE_PROVIDER_SITE_OTHER): Payer: 59 | Admitting: Physician Assistant

## 2018-09-12 ENCOUNTER — Other Ambulatory Visit: Payer: Self-pay

## 2018-09-12 DIAGNOSIS — M1711 Unilateral primary osteoarthritis, right knee: Secondary | ICD-10-CM | POA: Diagnosis not present

## 2018-09-12 MED ORDER — SODIUM HYALURONATE 60 MG/3ML IX PRSY
60.0000 mg | PREFILLED_SYRINGE | INTRA_ARTICULAR | Status: AC | PRN
  Administered 2018-09-12: 60 mg via INTRA_ARTICULAR

## 2018-09-12 NOTE — Progress Notes (Signed)
   Procedure Note  Patient: Nancy Lee             Date of Birth: May 26, 1956           MRN: [id number]             Visit Date: 09/12/2018  Procedures: Visit Diagnoses: Unilateral primary osteoarthritis, right knee  Large Joint Inj: R knee on 09/12/2018 12:51 PM Indications: diagnostic evaluation and pain Details: 22 G 1.5 in needle, superolateral approach  Arthrogram: No  Medications: 60 mg Sodium Hyaluronate 60 MG/3ML Outcome: tolerated well, no immediate complications Procedure, treatment alternatives, risks and benefits explained, specific risks discussed. Consent was given by the patient. Immediately prior to procedure a time out was called to verify the correct patient, procedure, equipment, support staff and site/side marked as required. Patient was prepped and draped in the usual sterile fashion.    The patient is here today for scheduled hyaluronic acid injection with Durolane in her right knee to treat the pain from osteoarthritis.  She is already tried and failed other forms of conservative treatment.  She states that if this works it will be great but if not she would consider knee replacement by the end of the year.  She does work in the school system.  Examination right knee she has global tenderness mainly along the medial joint line is the area of most her tenderness.  She does have patellofemoral crepitation and slight varus malalignment.  There is a mild effusion.  Her knee moves well.  It is ligamentously stable as well.  She did tolerate the Durolane injection in her right knee without difficulty.  All question concerns were answered and addressed.  We will see her back around the first week of September to consider further treatment options if needed.  At that visit I would like a standing AP and lateral of her right knee.

## 2018-10-09 DIAGNOSIS — Z91018 Allergy to other foods: Secondary | ICD-10-CM | POA: Diagnosis not present

## 2018-10-09 DIAGNOSIS — J301 Allergic rhinitis due to pollen: Secondary | ICD-10-CM | POA: Diagnosis not present

## 2018-10-09 DIAGNOSIS — J3089 Other allergic rhinitis: Secondary | ICD-10-CM | POA: Diagnosis not present

## 2018-10-13 ENCOUNTER — Other Ambulatory Visit (INDEPENDENT_AMBULATORY_CARE_PROVIDER_SITE_OTHER): Payer: Self-pay | Admitting: Orthopaedic Surgery

## 2018-12-09 ENCOUNTER — Other Ambulatory Visit (INDEPENDENT_AMBULATORY_CARE_PROVIDER_SITE_OTHER): Payer: Self-pay | Admitting: Orthopaedic Surgery

## 2019-01-30 ENCOUNTER — Other Ambulatory Visit (INDEPENDENT_AMBULATORY_CARE_PROVIDER_SITE_OTHER): Payer: Self-pay | Admitting: Orthopaedic Surgery

## 2019-01-31 NOTE — Telephone Encounter (Signed)
Ok to rf? 

## 2019-02-20 ENCOUNTER — Encounter: Payer: Self-pay | Admitting: Orthopaedic Surgery

## 2019-02-20 ENCOUNTER — Ambulatory Visit (INDEPENDENT_AMBULATORY_CARE_PROVIDER_SITE_OTHER): Payer: 59 | Admitting: Orthopaedic Surgery

## 2019-02-20 DIAGNOSIS — M25561 Pain in right knee: Secondary | ICD-10-CM | POA: Diagnosis not present

## 2019-02-20 DIAGNOSIS — G8929 Other chronic pain: Secondary | ICD-10-CM

## 2019-02-20 DIAGNOSIS — M1711 Unilateral primary osteoarthritis, right knee: Secondary | ICD-10-CM

## 2019-02-20 MED ORDER — METHYLPREDNISOLONE ACETATE 40 MG/ML IJ SUSP
40.0000 mg | INTRAMUSCULAR | Status: AC | PRN
  Administered 2019-02-20: 40 mg via INTRA_ARTICULAR

## 2019-02-20 MED ORDER — LIDOCAINE HCL 1 % IJ SOLN
3.0000 mL | INTRAMUSCULAR | Status: AC | PRN
  Administered 2019-02-20: 3 mL

## 2019-02-20 NOTE — Progress Notes (Signed)
Office Visit Note   Patient: Nancy Lee           Date of Birth: [birth year]           MRN: [id number] Visit Date: 02/20/2019              Requested by: Kelton Pillar, MD [place] [address] [employer] [address] [place],  [place] [address] PCP: Kelton Pillar, MD   Assessment & Plan: Visit Diagnoses:  1. Unilateral primary osteoarthritis, right knee   2. Chronic pain of right knee     Plan: She is still dealing with pain of moderate osteoarthritis of her right knee.  I do feel that she is a candidate again for hyaluronic acid.  We will try a steroid injection today and then will order Durolane for her right knee to place in her knee in 4 weeks from now.  All question concerns were answered and addressed.  She tolerated the injection well.  Follow-Up Instructions: Return in about 4 weeks (around 03/20/2019).   Orders:  Orders Placed This Encounter  Procedures  . Large Joint Inj   No orders of the defined types were placed in this encounter.     Procedures: Large Joint Inj on 02/20/2019 1:13 PM Indications: diagnostic evaluation and pain Details: 22 G 1.5 in needle, superolateral approach  Arthrogram: No  Medications: 3 mL lidocaine 1 %; 40 mg methylPREDNISolone acetate 40 MG/ML Outcome: tolerated well, no immediate complications Procedure, treatment alternatives, risks and benefits explained, specific risks discussed. Consent was given by the patient. Immediately prior to procedure a time out was called to verify the correct patient, procedure, equipment, support staff and site/side marked as required. Patient was prepped and draped in the usual sterile fashion.       Clinical Data: No additional findings.   Subjective: Chief Complaint  Patient presents with  . Right Knee - Follow-up  The patient comes in today about 6 months out from a hyaluronic acid injection in her right knee with Durolane to treat the pain from osteoarthritis.  She has been doing well overall but does  report knee pain that is worse sometimes at night when she is laying down.  It is activity related type of pain.  She denies any locking catching.  She does have known tricompartmental osteoarthritis in that knee.  She is denies any other acute changes in medical status.  She is still wanting to pursue conservative treatment for her knee.  HPI  Review of Systems She currently denies any headache, chest pain, shortness of breath, fever, chills, nausea, vomiting  Objective: Vital Signs: There were no vitals taken for this visit.  Physical Exam She is alert and orient x3 and in no acute distress Ortho Exam Examination of her right knee shows mild valgus malalignment.  The range of motion is full but painful.  There is just a mild effusion.  There is mainly lateral joint line tenderness but there is some patellofemoral crepitation as well.  The right knee does feel ligamentously stable Specialty Comments:  No specialty comments available.  Imaging: No results found.   PMFS History: Patient Active Problem List   Diagnosis Date Noted  . Unilateral primary osteoarthritis, right knee 02/13/2018  . Status post arthroscopy of right knee 02/13/2018  . Old complex tear of lateral meniscus of right knee 08/08/2017  . Chronic pain of right knee 03/05/2017   Past Medical History:  Diagnosis Date  . Allergy   . GERD (gastroesophageal reflux disease)   .  Hyperlipidemia     Family History  Problem Relation Age of Onset  . Hypertension Mother   . Alcohol abuse Father   . Coronary artery disease Unknown     Past Surgical History:  Procedure Laterality Date  . ABDOMINAL HYSTERECTOMY  2000   fibroids  . BUNIONECTOMY    . CHOLECYSTECTOMY  2007   gallstones  . DILATION AND CURETTAGE OF UTERUS    . DILATION AND CURETTE    . THYROIDECTOMY  05/02/2011   Procedure: THYROIDECTOMY;  Surgeon: Joyice Faster. Cornett, MD;  Location: [facility];  Service: General;  Laterality: Bilateral;  . TONSILLECTOMY      as a child  . TONSILLECTOMY AND ADENOIDECTOMY    . TOTAL THYROIDECTOMY  05/02/2011   Social History   Occupational History  . Not on file  Tobacco Use  . Smoking status: Former Smoker    Packs/day: 0.25    Years: 2.00    Pack years: 0.50    Types: Cigarettes    Quit date: 04/26/2007    Years since quitting: 11.8  . Smokeless tobacco: Never Used  Substance and Sexual Activity  . Alcohol use: Yes    Alcohol/week: 2.0 standard drinks    Types: 2 Glasses of wine per week  . Drug use: No  . Sexual activity: Yes    Birth control/protection: Post-menopausal

## 2019-02-21 ENCOUNTER — Telehealth: Payer: Self-pay

## 2019-02-21 NOTE — Telephone Encounter (Signed)
Right knee durolane

## 2019-02-25 NOTE — Telephone Encounter (Signed)
Noted  

## 2019-02-28 ENCOUNTER — Telehealth: Payer: Self-pay

## 2019-02-28 NOTE — Telephone Encounter (Signed)
Submitted VOB for Durolane, right knee. 

## 2019-03-11 ENCOUNTER — Telehealth: Payer: Self-pay

## 2019-03-11 NOTE — Telephone Encounter (Signed)
Approved for Durolane, right knee. Buy & Bill Covered at 100% of the allowable amount. Co-pay of $50.00 No PA required  Appt. 03/20/2019 with Dr. Ninfa Linden

## 2019-03-20 ENCOUNTER — Encounter: Payer: Self-pay | Admitting: Orthopaedic Surgery

## 2019-03-20 ENCOUNTER — Ambulatory Visit (INDEPENDENT_AMBULATORY_CARE_PROVIDER_SITE_OTHER): Payer: 59 | Admitting: Orthopaedic Surgery

## 2019-03-20 ENCOUNTER — Other Ambulatory Visit: Payer: Self-pay

## 2019-03-20 DIAGNOSIS — M1711 Unilateral primary osteoarthritis, right knee: Secondary | ICD-10-CM | POA: Diagnosis not present

## 2019-03-20 MED ORDER — SODIUM HYALURONATE 60 MG/3ML IX PRSY
60.0000 mg | PREFILLED_SYRINGE | INTRA_ARTICULAR | Status: AC | PRN
  Administered 2019-03-20: 60 mg via INTRA_ARTICULAR

## 2019-03-20 NOTE — Progress Notes (Signed)
   Procedure Note  Patient: Nancy Lee             Date of Birth: 09/23/1955           MRN: [id number]             Visit Date: 03/20/2019  Procedures: Visit Diagnoses:  1. Unilateral primary osteoarthritis, right knee     Large Joint Inj: R knee on 03/20/2019 2:26 PM Indications: diagnostic evaluation and pain Details: 22 G 1.5 in needle, superolateral approach  Arthrogram: No  Medications: 60 mg Sodium Hyaluronate 60 MG/3ML Outcome: tolerated well, no immediate complications Procedure, treatment alternatives, risks and benefits explained, specific risks discussed. Consent was given by the patient. Immediately prior to procedure a time out was called to verify the correct patient, procedure, equipment, support staff and site/side marked as required. Patient was prepped and draped in the usual sterile fashion.    The patient is a very pleasant 63 year old female is here for scheduled hyaluronic acid injection in her right knee with Durolane to treat the pain from osteoarthritis.  She is tried and failed other treatment measures.  She still has daily right knee pain and walks with a limp.  On examination of her right knee there is no effusion but is to have a painful arc of motion with patellofemoral crepitation as well as medial and lateral joint line tenderness.  I did place the Durolane injection in her right knee without difficulty.  All question concerns were answered and addressed.  Follow-up is as needed.

## 2019-03-28 ENCOUNTER — Other Ambulatory Visit (INDEPENDENT_AMBULATORY_CARE_PROVIDER_SITE_OTHER): Payer: Self-pay | Admitting: Orthopaedic Surgery

## 2019-05-19 ENCOUNTER — Other Ambulatory Visit: Payer: Self-pay

## 2019-05-19 DIAGNOSIS — Z20822 Contact with and (suspected) exposure to covid-19: Secondary | ICD-10-CM

## 2019-05-20 LAB — NOVEL CORONAVIRUS, NAA: SARS-CoV-2, NAA: DETECTED — AB

## 2019-06-05 ENCOUNTER — Other Ambulatory Visit: Payer: Self-pay

## 2019-06-05 MED ORDER — DICLOFENAC SODIUM 75 MG PO TBEC: DELAYED_RELEASE_TABLET | ORAL | 1 refills | Status: DC

## 2019-06-16 ENCOUNTER — Other Ambulatory Visit: Payer: Self-pay

## 2019-06-16 MED ORDER — DICLOFENAC SODIUM 75 MG PO TBEC: DELAYED_RELEASE_TABLET | ORAL | 2 refills | Status: DC

## 2019-07-22 ENCOUNTER — Telehealth: Payer: Self-pay | Admitting: Orthopaedic Surgery

## 2019-07-22 NOTE — Telephone Encounter (Signed)
She can have a cortisone injection, not yet time for the gel injection

## 2019-07-22 NOTE — Telephone Encounter (Signed)
Called patient back. Confirmed an appointment for a cortisone injection

## 2019-07-22 NOTE — Telephone Encounter (Signed)
Patient called.   She needs an injection in her right knee but doesn't know if it's time for one yet.   Call back number: [phone]

## 2019-07-31 ENCOUNTER — Ambulatory Visit (INDEPENDENT_AMBULATORY_CARE_PROVIDER_SITE_OTHER): Payer: 59 | Admitting: Orthopaedic Surgery

## 2019-07-31 ENCOUNTER — Other Ambulatory Visit: Payer: Self-pay

## 2019-07-31 DIAGNOSIS — M25561 Pain in right knee: Secondary | ICD-10-CM | POA: Diagnosis not present

## 2019-07-31 DIAGNOSIS — G8929 Other chronic pain: Secondary | ICD-10-CM

## 2019-07-31 DIAGNOSIS — M1711 Unilateral primary osteoarthritis, right knee: Secondary | ICD-10-CM

## 2019-07-31 MED ORDER — LIDOCAINE HCL 1 % IJ SOLN
3.0000 mL | INTRAMUSCULAR | Status: AC | PRN
  Administered 2019-07-31: 3 mL

## 2019-07-31 MED ORDER — METHYLPREDNISOLONE ACETATE 40 MG/ML IJ SUSP
40.0000 mg | INTRAMUSCULAR | Status: AC | PRN
  Administered 2019-07-31: 40 mg via INTRA_ARTICULAR

## 2019-07-31 NOTE — Progress Notes (Signed)
Office Visit Note   Patient: Nancy Lee           Date of Birth: [birth year]           MRN: [id number] Visit Date: 07/31/2019              Requested by: Kelton Pillar, MD [facility] [address] [facility] [address] [place],  [place] [address] PCP: Kelton Pillar, MD   Assessment & Plan: Visit Diagnoses:  1. Chronic pain of right knee   2. Unilateral primary osteoarthritis, right knee     Plan: Per her wishes I did provide a steroid injection in her right knee today without difficulty. We will see her back in 2 months to hopefully place hyaluronic acid again in that knee to buy her time before knee replacement surgery later in the year. All question concerns were answered and addressed. I have asked her to try to contact us in March so we can order the hyaluronic acid injection for April of this year. This will be for her right knee to treat the pain from osteoarthritis.  Follow-Up Instructions: Return in about 2 months (around 09/28/2019).   Orders:  Orders Placed This Encounter  Procedures  . Large Joint Inj   No orders of the defined types were placed in this encounter.     Procedures: Large Joint Inj: R knee on 07/31/2019 3:36 PM Indications: diagnostic evaluation and pain Details: 22 G 1.5 in needle, superolateral approach  Arthrogram: No  Medications: 3 mL lidocaine 1 %; 40 mg methylPREDNISolone acetate 40 MG/ML Outcome: tolerated well, no immediate complications Procedure, treatment alternatives, risks and benefits explained, specific risks discussed. Consent was given by the patient. Immediately prior to procedure a time out was called to verify the correct patient, procedure, equipment, support staff and site/side marked as required. Patient was prepped and draped in the usual sterile fashion.       Clinical Data: No additional findings.   Subjective: Chief Complaint  Patient presents with  . Right Knee - Follow-up, Pain  The patient is well-known to [place]. She has  been significant arthritis in her right knee. She has had multiple steroid injections in that knee in the past as well as most recently hyaluronic acid injection on October first 2020. She is still dealing with right knee pain. She is even had arthroscopic intervention. X-rays show worsening arthritis in the past on the knee and this was confirmed with an MRI and even knee arthroscopy. She says is been hurting with weightbearing activities. It does wake her at night and she is on her feet all day long. She feels like things are getting worse. At this point she feels like her right knee pain is definitely affecting her mobility, her quality, and her actives daily living. She would like to have a steroid injection today and consider hyaluronic acid again in the spring. She would like to hold off on knee replacement surgery until later in the year.  HPI  Review of Systems She currently denies any headache, chest pain, shortness of breath, fever, chills, nausea, vomiting  Objective: Vital Signs: There were no vitals taken for this visit.  Physical Exam She is alert and orient x3 and in no acute distress Ortho Exam Examination of her right knee shows neutral alignment. There is slight effusion. There is pain from flexion to extension but her range of motion is full. Her knee feels ligamentously stable and has medial and lateral joint line tenderness. Specialty Comments:  No specialty comments available.  Imaging: No results found.   PMFS History: Patient Active Problem List   Diagnosis Date Noted  . Unilateral primary osteoarthritis, right knee 02/13/2018  . Status post arthroscopy of right knee 02/13/2018  . Old complex tear of lateral meniscus of right knee 08/08/2017  . Chronic pain of right knee 03/05/2017   Past Medical History:  Diagnosis Date  . Allergy   . GERD (gastroesophageal reflux disease)   . Hyperlipidemia     Family History  Problem Relation Age of Onset  .  Hypertension Mother   . Alcohol abuse Father   . Coronary artery disease Unknown     Past Surgical History:  Procedure Laterality Date  . ABDOMINAL HYSTERECTOMY  2000   fibroids  . BUNIONECTOMY    . CHOLECYSTECTOMY  2007   gallstones  . DILATION AND CURETTAGE OF UTERUS    . DILATION AND CURETTE    . THYROIDECTOMY  05/02/2011   Procedure: THYROIDECTOMY;  Surgeon: Joyice Faster. Cornett, MD;  Location: [facility];  Service: General;  Laterality: Bilateral;  . TONSILLECTOMY     as a child  . TONSILLECTOMY AND ADENOIDECTOMY    . TOTAL THYROIDECTOMY  05/02/2011   Social History   Occupational History  . Not on file  Tobacco Use  . Smoking status: Former Smoker    Packs/day: 0.25    Years: 2.00    Pack years: 0.50    Types: Cigarettes    Quit date: 04/26/2007    Years since quitting: 12.2  . Smokeless tobacco: Never Used  Substance and Sexual Activity  . Alcohol use: Yes    Alcohol/week: 2.0 standard drinks    Types: 2 Glasses of wine per week  . Drug use: No  . Sexual activity: Yes    Birth control/protection: Post-menopausal

## 2019-08-16 ENCOUNTER — Ambulatory Visit: Payer: 59 | Attending: Internal Medicine

## 2019-08-16 DIAGNOSIS — Z23 Encounter for immunization: Secondary | ICD-10-CM

## 2019-08-16 NOTE — Progress Notes (Signed)
   Covid-19 Vaccination Clinic  Name:  Nancy Lee    MRN: [id number] DOB: 1955-10-14  08/16/2019  Nancy Lee was observed post Covid-19 immunization for 15 minutes without incidence. She was provided with Vaccine Information Sheet and instruction to access the V-Safe system.   Nancy Lee was instructed to call 911 with any severe reactions post vaccine: Marland Kitchen Difficulty breathing  . Swelling of your face and throat  . A fast heartbeat  . A bad rash all over your body  . Dizziness and weakness    Immunizations Administered    Name Date Dose VIS Date Route   Pfizer COVID-19 Vaccine 08/16/2019  4:18 PM 0.3 mL 05/30/2019 Intramuscular   Manufacturer: [place]   Lot: [id number]   [place]: [id number]

## 2019-09-02 ENCOUNTER — Telehealth: Payer: Self-pay | Admitting: Orthopaedic Surgery

## 2019-09-02 NOTE — Telephone Encounter (Signed)
Pt called stating Dr. Ninfa Linden told her to call in march so that they could order a gel medicine, pt stated she couldn't remember the name. Pt stated she does have an appt on 10/02/19.  [phone]

## 2019-09-02 NOTE — Telephone Encounter (Signed)
Can we order for her please

## 2019-09-03 ENCOUNTER — Telehealth: Payer: Self-pay

## 2019-09-03 NOTE — Telephone Encounter (Signed)
Pt called and informed of approval and copay and stated understanding

## 2019-09-03 NOTE — Telephone Encounter (Signed)
Approved for Durolane-Right knee Dr. Margarito Liner and Bill $50 copay 5% OOP No prior auth required

## 2019-09-03 NOTE — Telephone Encounter (Signed)
Appointment with Dr. Ninfa Linden 10/02/19

## 2019-09-03 NOTE — Telephone Encounter (Signed)
Submitted for VOB for Durolane-Right knee 

## 2019-09-06 ENCOUNTER — Ambulatory Visit: Payer: 59 | Attending: Internal Medicine

## 2019-09-06 ENCOUNTER — Ambulatory Visit: Payer: Self-pay

## 2019-09-06 DIAGNOSIS — Z23 Encounter for immunization: Secondary | ICD-10-CM

## 2019-09-06 NOTE — Progress Notes (Signed)
   Covid-19 Vaccination Clinic  Name:  Nancy Lee    MRN: [id number] DOB: 10/10/1955  09/06/2019  [name] was observed post Covid-19 immunization for 15 minutes without incident. She was provided with Vaccine Information Sheet and instruction to access the V-Safe system.   [name] was instructed to call 911 with any severe reactions post vaccine: Marland Kitchen Difficulty breathing  . Swelling of face and throat  . A fast heartbeat  . A bad rash all over body  . Dizziness and weakness   Immunizations Administered    Name Date Dose VIS Date Route   Pfizer COVID-19 Vaccine 09/06/2019 11:51 AM 0.3 mL 05/30/2019 Intramuscular   Manufacturer: [place]   Lot: [id number]   [place]: [id number]

## 2019-09-29 ENCOUNTER — Ambulatory Visit: Payer: 59 | Admitting: Orthopaedic Surgery

## 2019-10-02 ENCOUNTER — Other Ambulatory Visit: Payer: Self-pay

## 2019-10-02 ENCOUNTER — Encounter: Payer: Self-pay | Admitting: Orthopaedic Surgery

## 2019-10-02 ENCOUNTER — Ambulatory Visit: Payer: 59 | Admitting: Orthopaedic Surgery

## 2019-10-02 DIAGNOSIS — M1711 Unilateral primary osteoarthritis, right knee: Secondary | ICD-10-CM

## 2019-10-02 MED ORDER — SODIUM HYALURONATE 60 MG/3ML IX PRSY
60.0000 mg | PREFILLED_SYRINGE | INTRA_ARTICULAR | Status: AC | PRN
  Administered 2019-10-02: 60 mg via INTRA_ARTICULAR

## 2019-10-02 NOTE — Progress Notes (Signed)
   Procedure Note  Patient: Nancy Lee             Date of Birth: January 29, 1956           MRN: [id number]             Visit Date: 10/02/2019  Procedures: Visit Diagnoses:  1. Unilateral primary osteoarthritis, right knee     Large Joint Inj: R knee on 10/02/2019 3:14 PM Indications: diagnostic evaluation and pain Details: 22 G 1.5 in needle, superolateral approach  Arthrogram: No  Medications: 60 mg Sodium Hyaluronate 60 MG/3ML Outcome: tolerated well, no immediate complications Procedure, treatment alternatives, risks and benefits explained, specific risks discussed. Consent was given by the patient. Immediately prior to procedure a time out was called to verify the correct patient, procedure, equipment, support staff and site/side marked as required. Patient was prepped and draped in the usual sterile fashion.     The patient is here today for scheduled hyaluronic acid injection with Durolane in her right knee to treat the pain from osteoarthritis.  She has tried and failed other conservative treatment measures including steroid injections.  She is thinking at some point she would like to proceed with knee replacement surgery but both her and I want to see if this will help decrease her pain.  She has had no other acute change in her medical status.  Her right knee pain is daily.  On examination of her right knee today there is no effusion but there is slight valgus malalignment as she walks.  There is patellofemoral crepitation and medial lateral joint line tenderness.  I did place the hyaluronic acid injection easily into her right knee.  We will see her back in 3 months with her now to see how she is doing overall.  At that point she will like to schedule knee replacement surgery we would do so only if this is not helping her.

## 2019-10-29 ENCOUNTER — Other Ambulatory Visit: Payer: Self-pay | Admitting: Family Medicine

## 2019-10-29 DIAGNOSIS — Z1231 Encounter for screening mammogram for malignant neoplasm of breast: Secondary | ICD-10-CM

## 2019-11-17 ENCOUNTER — Other Ambulatory Visit: Payer: Self-pay | Admitting: Orthopaedic Surgery

## 2020-01-01 ENCOUNTER — Encounter: Payer: Self-pay | Admitting: Orthopaedic Surgery

## 2020-01-01 ENCOUNTER — Other Ambulatory Visit: Payer: Self-pay

## 2020-01-01 ENCOUNTER — Ambulatory Visit: Payer: 59 | Admitting: Orthopaedic Surgery

## 2020-01-01 VITALS — Ht 63.0 in | Wt 172.4 lb

## 2020-01-01 DIAGNOSIS — M25561 Pain in right knee: Secondary | ICD-10-CM

## 2020-01-01 DIAGNOSIS — G8929 Other chronic pain: Secondary | ICD-10-CM

## 2020-01-01 DIAGNOSIS — M1711 Unilateral primary osteoarthritis, right knee: Secondary | ICD-10-CM

## 2020-01-01 MED ORDER — METHYLPREDNISOLONE ACETATE 40 MG/ML IJ SUSP
40.0000 mg | INTRAMUSCULAR | Status: AC | PRN
  Administered 2020-01-01: 40 mg via INTRA_ARTICULAR

## 2020-01-01 MED ORDER — LIDOCAINE HCL 1 % IJ SOLN
3.0000 mL | INTRAMUSCULAR | Status: AC | PRN
  Administered 2020-01-01: 3 mL

## 2020-01-01 NOTE — Progress Notes (Signed)
Office Visit Note   Patient: Nancy Lee           Date of Birth: [birth year]           MRN: [id number] Visit Date: 01/01/2020              Requested by: Kelton Pillar, MD [facility] [address] [facility] [address] [place],  [place] [address] PCP: Kelton Pillar, MD   Assessment & Plan: Visit Diagnoses:  1. Unilateral primary osteoarthritis, right knee   2. Chronic pain of right knee     Plan: With the severity of her arthritis of her right knee combined with the failure of anti-inflammatories, steroids and hyaluronic acid as well as arthroscopic intervention, she does wish proceed with a total knee arthroplasty and I agree with this.  I explained in detail what the surgery involves we talked about the risk and benefits of surgery.  She would likely need to be out of work for least 6weeks to 12 weeks postoperatively as she recovers and goes to therapy.  All question concerns were answered and addressed.  She would like to have this scheduled after Labor Day.  Follow-Up Instructions: Return for 2 weeks post-op.   Orders:  Orders Placed This Encounter  Procedures  . Large Joint Inj   No orders of the defined types were placed in this encounter.     Procedures: Large Joint Inj: R knee on 01/01/2020 2:58 PM Indications: diagnostic evaluation and pain Details: 22 G 1.5 in needle, superolateral approach  Arthrogram: No  Medications: 3 mL lidocaine 1 %; 40 mg methylPREDNISolone acetate 40 MG/ML Outcome: tolerated well, no immediate complications Procedure, treatment alternatives, risks and benefits explained, specific risks discussed. Consent was given by the patient. Immediately prior to procedure a time out was called to verify the correct patient, procedure, equipment, support staff and site/side marked as required. Patient was prepped and draped in the usual sterile fashion.       Clinical Data: No additional findings.   Subjective: Chief Complaint  Patient presents with  .  Right Knee - Follow-up  The patient comes in for continued follow-up of her right arthritic knee.  We have tried several interventions for this knee including hyaluronic acid, steroid injections and even arthroscopy.  She is 64 years old very active.  Her knee has been worsening in terms of the pain and swelling as well as locking and catching.  At this point she is interested in proceeding with knee replacement surgery.  She has been dealing with this knee for multiple years now.  She wants to consider knee replacement surgery after Labor Day.  I have already shown her in the past what knee replacement model looks like we talked in detail about this surgery and what to expect.  HPI  Review of Systems She currently denies any headache, chest pain, shortness of breath, fever, chills, nausea, vomiting  Objective: Vital Signs: Ht 5\' 3"  (1.6 m)   Wt 172 lb 6.4 oz (78.2 kg)   BMI 30.54 kg/m   Physical Exam She is alert and orient x3 and in no acute distress Ortho Exam Examination of her right knee shows a moderate effusion.  She has valgus malalignment with painful arc of motion of the knee. Specialty Comments:  No specialty comments available.  Imaging: No results found.   PMFS History: Patient Active Problem List   Diagnosis Date Noted  . Unilateral primary osteoarthritis, right knee 02/13/2018  . Status post arthroscopy of right  knee 02/13/2018  . Old complex tear of lateral meniscus of right knee 08/08/2017  . Chronic pain of right knee 03/05/2017   Past Medical History:  Diagnosis Date  . Allergy   . GERD (gastroesophageal reflux disease)   . Hyperlipidemia     Family History  Problem Relation Age of Onset  . Hypertension Mother   . Alcohol abuse Father   . Coronary artery disease Unknown     Past Surgical History:  Procedure Laterality Date  . ABDOMINAL HYSTERECTOMY  2000   fibroids  . BUNIONECTOMY    . CHOLECYSTECTOMY  2007   gallstones  . DILATION AND CURETTAGE  OF UTERUS    . DILATION AND CURETTE    . THYROIDECTOMY  05/02/2011   Procedure: THYROIDECTOMY;  Surgeon: Joyice Faster. Cornett, MD;  Location: [facility];  Service: General;  Laterality: Bilateral;  . TONSILLECTOMY     as a child  . TONSILLECTOMY AND ADENOIDECTOMY    . TOTAL THYROIDECTOMY  05/02/2011   Social History   Occupational History  . Not on file  Tobacco Use  . Smoking status: Former Smoker    Packs/day: 0.25    Years: 2.00    Pack years: 0.50    Types: Cigarettes    Quit date: 04/26/2007    Years since quitting: 12.6  . Smokeless tobacco: Never Used  Substance and Sexual Activity  . Alcohol use: Yes    Alcohol/week: 2.0 standard drinks    Types: 2 Glasses of wine per week  . Drug use: No  . Sexual activity: Yes    Birth control/protection: Post-menopausal

## 2020-01-30 IMAGING — MG DIGITAL SCREENING BILATERAL MAMMOGRAM WITH TOMO AND CAD
8 series · 8 of 24 positions shown · non-contrast
Comparison: Previous exam(s).

CLINICAL DATA: Screening.

EXAM:
DIGITAL SCREENING BILATERAL MAMMOGRAM WITH TOMO AND CAD

[L CC synth-2D]
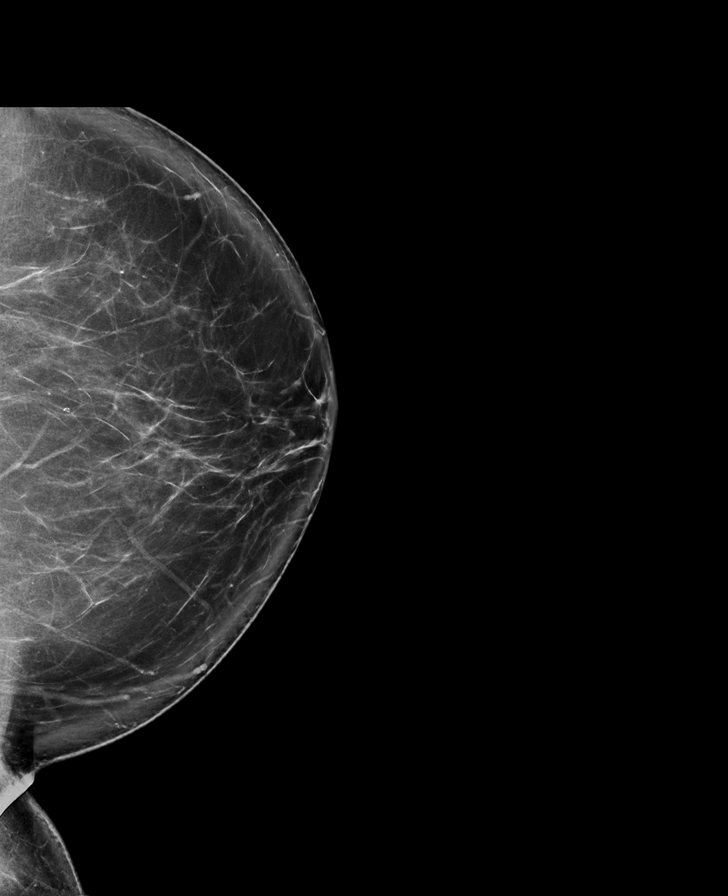

[R CC synth-2D]
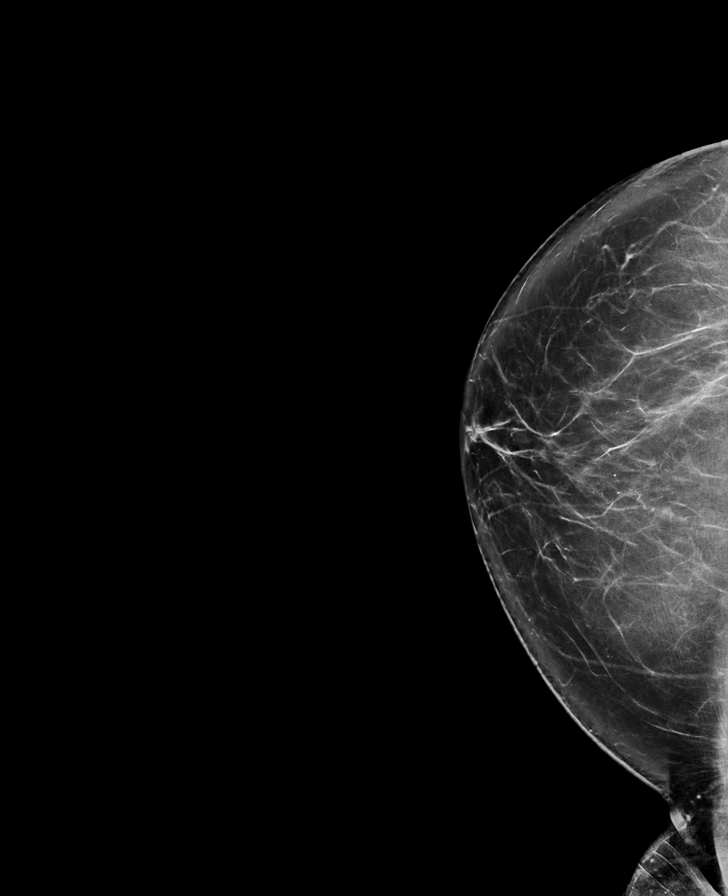

[L MLO synth-2D]
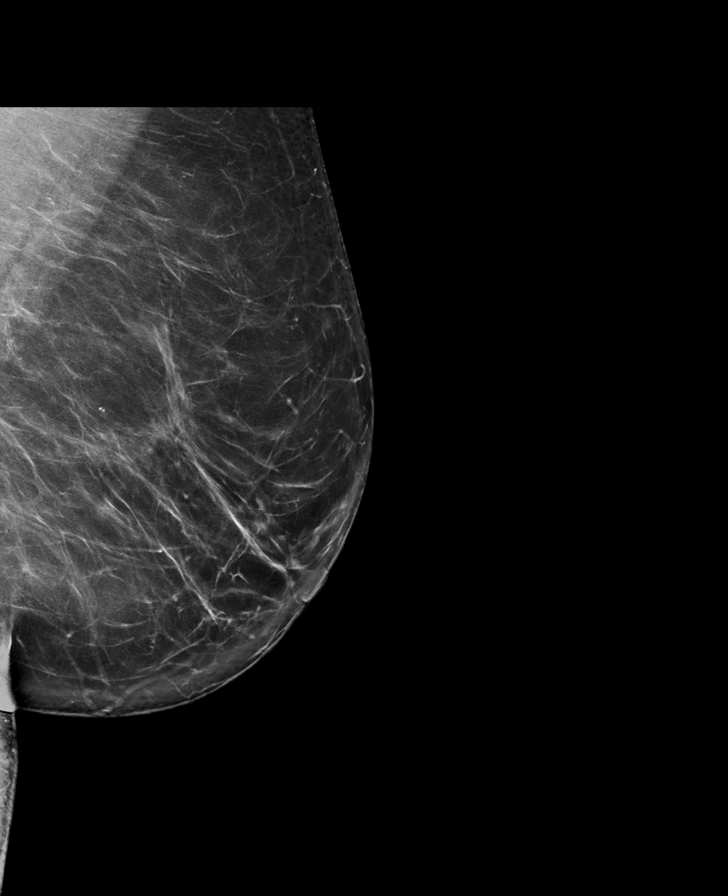

[R MLO synth-2D]
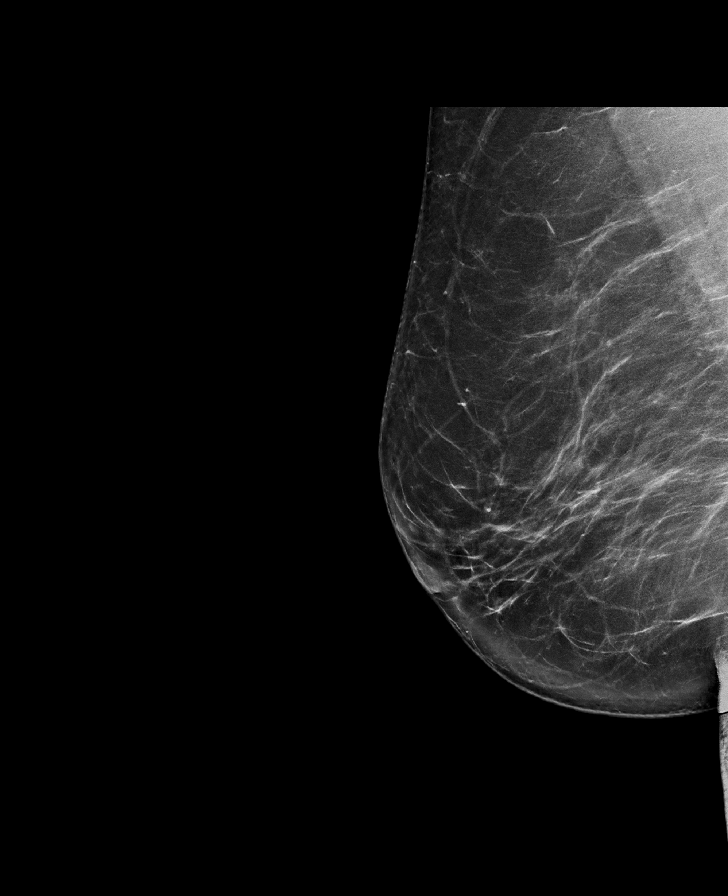

[R CC tomo · tomo slice 43/86.0]
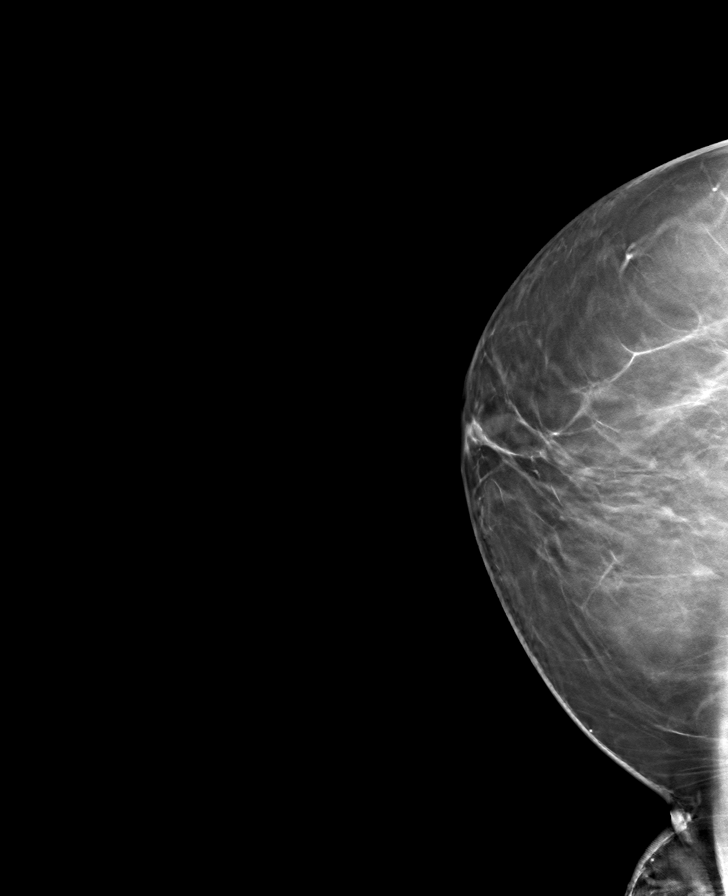

[R MLO tomo · tomo slice 47/93.0]
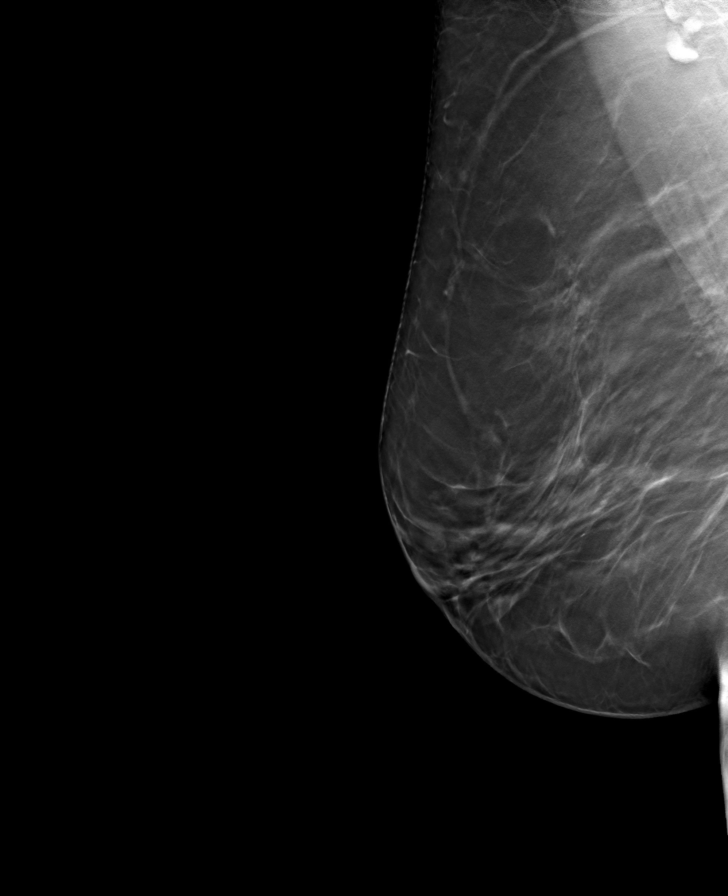

[L MLO tomo · tomo slice 46/91.0]
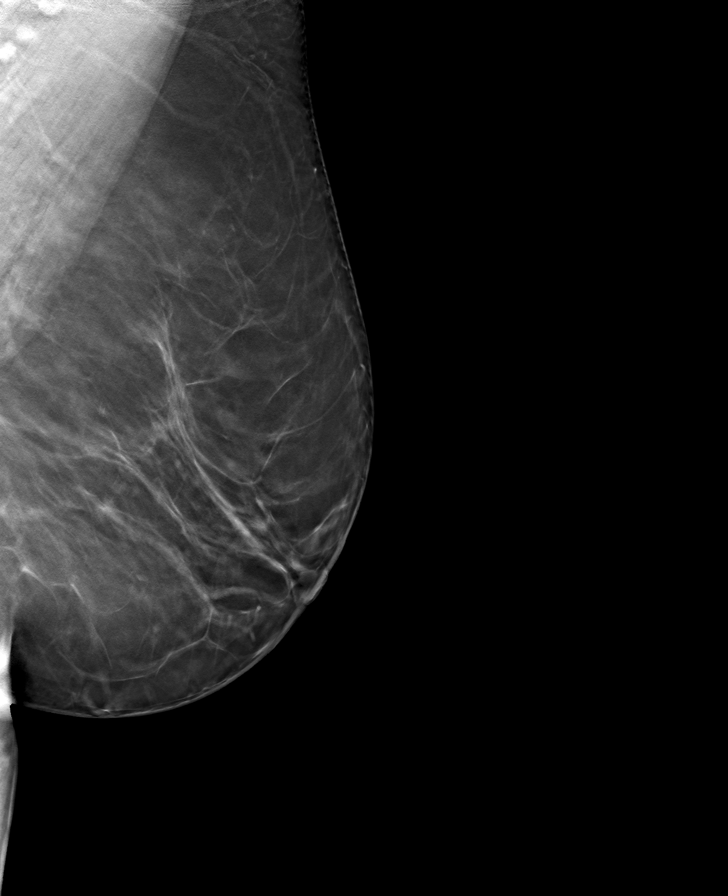

[L CC tomo · tomo slice 48/95.0]
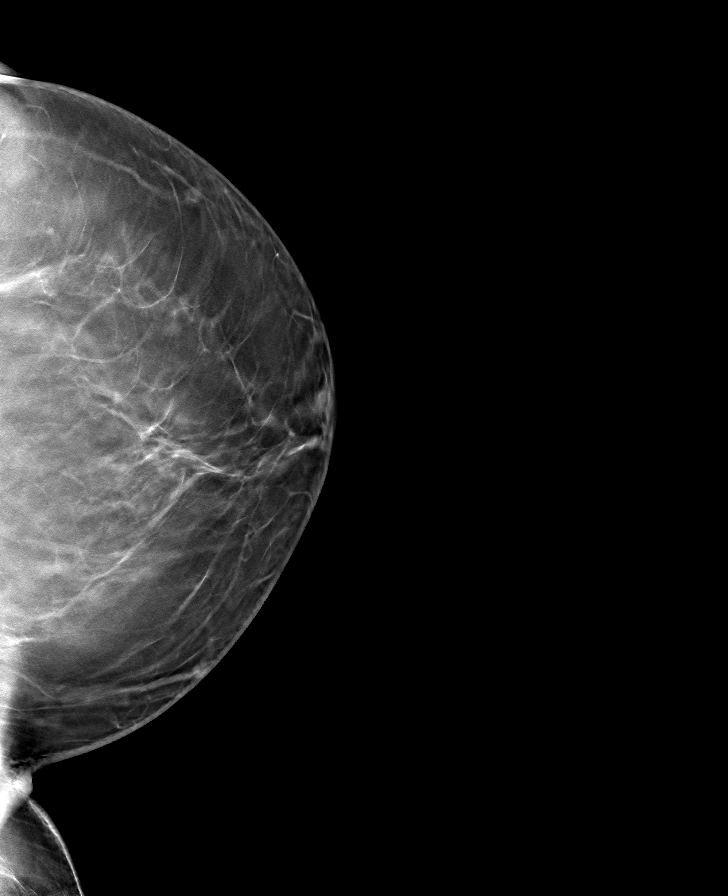

[8 of 24 positions shown; findings below may reference images not displayed]

ACR Breast Density Category b: There are scattered areas of
fibroglandular density.
FINDINGS: There are no findings suspicious for malignancy. Images were
processed with CAD.
IMPRESSION: No mammographic evidence of malignancy. A result letter of this
screening mammogram will be mailed directly to the patient.

RECOMMENDATION:
Screening mammogram in one year. (Code:[id number])

BI-RADS CATEGORY  1: Negative.

## 2020-02-02 ENCOUNTER — Other Ambulatory Visit: Payer: Self-pay

## 2020-02-09 ENCOUNTER — Other Ambulatory Visit: Payer: Self-pay

## 2020-02-09 ENCOUNTER — Ambulatory Visit
Admission: RE | Admit: 2020-02-09 | Discharge: 2020-02-09 | Disposition: A | Discharge status: Home / Self Care | Payer: 59 | Source: Ambulatory Visit | Attending: Family Medicine | Admitting: Family Medicine

## 2020-02-09 DIAGNOSIS — Z1231 Encounter for screening mammogram for malignant neoplasm of breast: Secondary | ICD-10-CM

## 2020-02-27 ENCOUNTER — Telehealth: Payer: Self-pay | Admitting: Orthopaedic Surgery

## 2020-02-27 NOTE — Telephone Encounter (Signed)
Patient called. She need a letter stating that her surgery is canceled. She would like to pick it up on Tuesday 9/14. Her call back number is [phone]

## 2020-03-01 NOTE — Telephone Encounter (Signed)
Patient aware note at front desk  

## 2020-03-23 ENCOUNTER — Inpatient Hospital Stay: Payer: 59 | Admitting: Orthopaedic Surgery

## 2020-04-16 ENCOUNTER — Other Ambulatory Visit: Payer: Self-pay | Admitting: Orthopaedic Surgery

## 2020-04-16 NOTE — Telephone Encounter (Signed)
For some reason they know will not let me approve the medication or deny the medication.  I am fine with her having this.  Please call it into the pharmacy to take 1 twice a day as needed for pain and inflammation.  Thank you.  #60 with 3 refills.

## 2020-04-16 NOTE — Telephone Encounter (Signed)
Rx was called in as per MD

## 2020-05-25 ENCOUNTER — Other Ambulatory Visit: Payer: Self-pay | Admitting: Family Medicine

## 2020-05-25 DIAGNOSIS — M8580 Other specified disorders of bone density and structure, unspecified site: Secondary | ICD-10-CM

## 2020-06-30 ENCOUNTER — Ambulatory Visit: Payer: 59 | Admitting: Orthopaedic Surgery

## 2020-06-30 DIAGNOSIS — G8929 Other chronic pain: Secondary | ICD-10-CM | POA: Diagnosis not present

## 2020-06-30 DIAGNOSIS — M25561 Pain in right knee: Secondary | ICD-10-CM | POA: Diagnosis not present

## 2020-06-30 DIAGNOSIS — M1711 Unilateral primary osteoarthritis, right knee: Secondary | ICD-10-CM

## 2020-06-30 MED ORDER — LIDOCAINE HCL 1 % IJ SOLN
3.0000 mL | INTRAMUSCULAR | Status: AC | PRN
  Administered 2020-06-30: 3 mL

## 2020-06-30 MED ORDER — METHYLPREDNISOLONE ACETATE 40 MG/ML IJ SUSP
40.0000 mg | INTRAMUSCULAR | Status: AC | PRN
Start: 2020-06-30 — End: 2020-06-30
  Administered 2020-06-30: 40 mg via INTRA_ARTICULAR

## 2020-06-30 NOTE — Progress Notes (Signed)
Office Visit Note   Patient: Nancy Lee           Date of Birth: Nov 29, 1955           MRN: [id number] Visit Date: 06/30/2020              Requested by: Kelton Pillar, MD [facility] [address] [facility] [address] [place],  [place] [address] PCP: Kelton Pillar, MD   Assessment & Plan: Visit Diagnoses:  1. Unilateral primary osteoarthritis, right knee   2. Chronic pain of right knee     Plan: Since that will be 3 months before her knee replacement for her right knee, I did recommend a steroid injection in her knee today and she requested this as well.  We will also try a hinged knee brace just for some support.  We will work on getting her scheduled for a knee replacement in April for her right knee.  All questions and concerns were answered and addressed.  Follow-Up Instructions: Return for 2 weeks post-op.   Orders:  Orders Placed This Encounter  Procedures  . Large Joint Inj   No orders of the defined types were placed in this encounter.     Procedures: Large Joint Inj: R knee on 06/30/2020 2:40 PM Indications: diagnostic evaluation and pain Details: 22 G 1.5 in needle, superolateral approach  Arthrogram: No  Medications: 3 mL lidocaine 1 %; 40 mg methylPREDNISolone acetate 40 MG/ML Outcome: tolerated well, no immediate complications Procedure, treatment alternatives, risks and benefits explained, specific risks discussed. Consent was given by the patient. Immediately prior to procedure a time out was called to verify the correct patient, procedure, equipment, support staff and site/side marked as required. Patient was prepped and draped in the usual sterile fashion.       Clinical Data: No additional findings.   Subjective: Chief Complaint  Patient presents with  . Right Knee - Pain  The patient is well-known to me.  She has severe end-stage arthritis of her right knee and we were going to schedule for knee replacement surgery but then the [place] pandemic hit.  We had  to cancel her surgery.  She is ready to have this rescheduled again at this standpoint.  She would still like to wait until April before her retirement.  Her right knee does show end-stage arthritis and this has been well-documented with previous exams and office notes.  Her x-rays also show significant end-stage arthritis.  She has tried and failed all forms conservative treatment for over a year including activity modification, quad training exercises, anti-inflammatories, multiple injections and using an assistive device.  She is not a diabetic.  She denies any acute change in her medical status.  Her right knee has been getting worse in terms of locking and catching on her at this point.  HPI  Review of Systems She currently denies any headache, chest pain, shortness of breath, fever, chills, nausea, vomiting  Objective: Vital Signs: There were no vitals taken for this visit.  Physical Exam She is alert and oriented x3  Ortho Exam examination of her right knee shows no effusion today.  She has painful medial lateral joint line and significant patellofemoral crepitation when putting her through flexion and extension.  The knee is ligamentously stable but very painful. Specialty Comments:  No specialty comments available.  Imaging: No results found. Previous knee x-rays were read reviewed showing tricompartmental arthritis of the right knee.  PMFS History: Patient Active Problem List   Diagnosis Date  Noted  . Unilateral primary osteoarthritis, right knee 02/13/2018  . Status post arthroscopy of right knee 02/13/2018  . Old complex tear of lateral meniscus of right knee 08/08/2017  . Chronic pain of right knee 03/05/2017   Past Medical History:  Diagnosis Date  . Allergy   . GERD (gastroesophageal reflux disease)   . Hyperlipidemia     Family History  Problem Relation Age of Onset  . Hypertension Mother   . Breast cancer Mother   . Alcohol abuse Father   . Coronary artery  disease Other     Past Surgical History:  Procedure Laterality Date  . ABDOMINAL HYSTERECTOMY  2000   fibroids  . BUNIONECTOMY    . CHOLECYSTECTOMY  2007   gallstones  . DILATION AND CURETTAGE OF UTERUS    . DILATION AND CURETTE    . THYROIDECTOMY  05/02/2011   Procedure: THYROIDECTOMY;  Surgeon: Joyice Faster. Cornett, MD;  Location: [facility];  Service: General;  Laterality: Bilateral;  . TONSILLECTOMY     as a child  . TONSILLECTOMY AND ADENOIDECTOMY    . TOTAL THYROIDECTOMY  05/02/2011   Social History   Occupational History  . Not on file  Tobacco Use  . Smoking status: Former Smoker    Packs/day: 0.25    Years: 2.00    Pack years: 0.50    Types: Cigarettes    Quit date: 04/26/2007    Years since quitting: 13.1  . Smokeless tobacco: Never Used  Substance and Sexual Activity  . Alcohol use: Yes    Alcohol/week: 2.0 standard drinks    Types: 2 Glasses of wine per week  . Drug use: No  . Sexual activity: Yes    Birth control/protection: Post-menopausal

## 2020-07-17 ENCOUNTER — Other Ambulatory Visit: Payer: Self-pay | Admitting: Orthopaedic Surgery

## 2020-08-27 ENCOUNTER — Other Ambulatory Visit: Payer: Self-pay

## 2020-09-01 ENCOUNTER — Other Ambulatory Visit: Payer: Self-pay | Admitting: Family Medicine

## 2020-09-01 ENCOUNTER — Telehealth: Payer: Self-pay | Admitting: Orthopaedic Surgery

## 2020-09-01 ENCOUNTER — Other Ambulatory Visit: Payer: Self-pay

## 2020-09-01 ENCOUNTER — Ambulatory Visit
Admission: RE | Admit: 2020-09-01 | Discharge: 2020-09-01 | Disposition: A | Discharge status: Home / Self Care | Payer: Medicare Other | Source: Ambulatory Visit | Attending: Family Medicine | Admitting: Family Medicine

## 2020-09-01 DIAGNOSIS — Z1231 Encounter for screening mammogram for malignant neoplasm of breast: Secondary | ICD-10-CM

## 2020-09-01 DIAGNOSIS — M8580 Other specified disorders of bone density and structure, unspecified site: Secondary | ICD-10-CM

## 2020-09-01 NOTE — Telephone Encounter (Signed)
Received $25.00 Money order,medical records release form and disability paper work from patient     [place] to [employer] today

## 2020-09-23 NOTE — Progress Notes (Signed)
DUE TO COVID-19 ONLY ONE VISITOR IS ALLOWED TO COME WITH YOU AND STAY IN THE WAITING ROOM ONLY DURING PRE OP AND PROCEDURE DAY OF SURGERY. THE 1 VISITOR  MAY VISIT WITH YOU AFTER SURGERY IN YOUR PRIVATE ROOM DURING VISITING HOURS ONLY!  YOU NEED TO HAVE A COVID 19 TEST ON____4/19/2022 ___ @_______ , THIS TEST MUST BE DONE BEFORE SURGERY,  COVID TESTING SITE [address] [place] [address], IT IS ON THE RIGHT GOING OUT [address] APPROXIMATELY  2 MINUTES PAST [employer] ON THE RIGHT. ONCE YOUR COVID TEST IS COMPLETED,  PLEASE BEGIN THE QUARANTINE INSTRUCTIONS AS OUTLINED IN YOUR HANDOUT.                Nancy Lee  09/23/2020   Your procedure is scheduled on:  10/08/2020   Report to [facility] Main  Entrance   Report to admitting a      0715am t AM     Call this number if you have problems the morning of surgery [phone]    REMEMBER: NO  SOLID FOOD CANDY OR GUM AFTER MIDNIGHT. CLEAR LIQUIDS UNTIL     0645am     . NOTHING BY MOUTH EXCEPT CLEAR LIQUIDS UNTIL    0645am    . PLEASE FINISH ENSURE DRINK PER SURGEON ORDER  WHICH NEEDS TO BE COMPLETED AT   0645 am    .      CLEAR LIQUID DIET   Foods Allowed                                                                    Coffee and tea, regular and decaf                            Fruit ices (not with fruit pulp)                                      Iced Popsicles                                    Carbonated beverages, regular and diet                                    Cranberry, grape and apple juices Sports drinks like Gatorade Lightly seasoned clear broth or consume(fat free) Sugar, honey syrup ___________________________________________________________________      BRUSH YOUR TEETH MORNING OF SURGERY AND RINSE YOUR MOUTH OUT, NO CHEWING GUM CANDY OR MINTS.     Take these medicines the morning of surgery with A SIP OF WATER:     Effexor, prilosec, synthroid  DO NOT TAKE ANY DIABETIC  MEDICATIONS DAY OF YOUR SURGERY                               You may not have any metal on your body including hair pins and  piercings  Do not wear jewelry, make-up, lotions, powders or perfumes, deodorant             Do not wear nail polish on your fingernails.  Do not shave  48 hours prior to surgery.              Men may shave face and neck.   Do not bring valuables to the hospital. [place].  Contacts, dentures or bridgework may not be worn into surgery.  Leave suitcase in the car. After surgery it may be brought to your room.     Patients discharged the day of surgery will not be allowed to drive home. IF YOU ARE HAVING SURGERY AND GOING HOME THE SAME DAY, YOU MUST HAVE AN ADULT TO DRIVE YOU HOME AND BE WITH YOU FOR 24 HOURS. YOU MAY GO HOME BY TAXI OR UBER OR ORTHERWISE, BUT AN ADULT MUST ACCOMPANY YOU HOME AND STAY WITH YOU FOR 24 HOURS.  Name and phone number of your driver:  Special Instructions: N/A              Please read over the following fact sheets you were given: _____________________________________________________________________  [facility] - Preparing for Surgery Before surgery, you can play an important role.  Because skin is not sterile, your skin needs to be as free of germs as possible.  You can reduce the number of germs on your skin by washing with CHG (chlorahexidine gluconate) soap before surgery.  CHG is an antiseptic cleaner which kills germs and bonds with the skin to continue killing germs even after washing. Please DO NOT use if you have an allergy to CHG or antibacterial soaps.  If your skin becomes reddened/irritated stop using the CHG and inform your nurse when you arrive at Short Stay. Do not shave (including legs and underarms) for at least 48 hours prior to the first CHG shower.  You may shave your face/neck. Please follow these instructions carefully:  1.  Shower with CHG Soap the night  before surgery and the  morning of Surgery.  2.  If you choose to wash your hair, wash your hair first as usual with your  normal  shampoo.  3.  After you shampoo, rinse your hair and body thoroughly to remove the  shampoo.                           4.  Use CHG as you would any other liquid soap.  You can apply chg directly  to the skin and wash                       Gently with a scrungie or clean washcloth.  5.  Apply the CHG Soap to your body ONLY FROM THE NECK DOWN.   Do not use on face/ open                           Wound or open sores. Avoid contact with eyes, ears mouth and genitals (private parts).                       Wash face,  Genitals (private parts) with your normal soap.             6.  Wash thoroughly, paying special attention to the area where your surgery  will be performed.  7.  Thoroughly rinse your body with warm water from the neck down.  8.  DO NOT shower/wash with your normal soap after using and rinsing off  the CHG Soap.                9.  Pat yourself dry with a clean towel.            10.  Wear clean pajamas.            11.  Place clean sheets on your bed the night of your first shower and do not  sleep with pets. Day of Surgery : Do not apply any lotions/deodorants the morning of surgery.  Please wear clean clothes to the hospital/surgery center.  FAILURE TO FOLLOW THESE INSTRUCTIONS MAY RESULT IN THE CANCELLATION OF YOUR SURGERY PATIENT SIGNATURE_________________________________  NURSE SIGNATURE__________________________________  ________________________________________________________________________

## 2020-09-28 ENCOUNTER — Other Ambulatory Visit: Payer: Self-pay | Admitting: Physician Assistant

## 2020-09-28 NOTE — Progress Notes (Signed)
Need orders in epic  Preop on 09/29/20.

## 2020-09-29 ENCOUNTER — Encounter (HOSPITAL_COMMUNITY): Payer: Self-pay

## 2020-09-29 ENCOUNTER — Other Ambulatory Visit: Payer: Self-pay

## 2020-09-29 ENCOUNTER — Encounter (HOSPITAL_COMMUNITY)
Admission: RE | Admit: 2020-09-29 | Discharge: 2020-09-29 | Disposition: A | Discharge status: Home / Self Care | Payer: Medicare Other | Source: Ambulatory Visit | Attending: Orthopaedic Surgery | Admitting: Orthopaedic Surgery

## 2020-09-29 DIAGNOSIS — Z01818 Encounter for other preprocedural examination: Secondary | ICD-10-CM | POA: Diagnosis not present

## 2020-09-29 HISTORY — DX: Hypothyroidism, unspecified: E03.9

## 2020-09-29 HISTORY — DX: Unspecified osteoarthritis, unspecified site: M19.90

## 2020-09-29 HISTORY — DX: Personal history of urinary calculi: Z87.442

## 2020-09-29 LAB — BASIC METABOLIC PANEL
Anion gap: 9 (ref 5–15)
BUN: 28 mg/dL — ABNORMAL HIGH (ref 8–23)
CO2: 28 mmol/L (ref 22–32)
Calcium: 9.4 mg/dL (ref 8.9–10.3)
Chloride: 107 mmol/L (ref 98–111)
Creatinine, Ser: 0.63 mg/dL (ref 0.44–1.00)
GFR, Estimated: >60 mL/min (ref >60)
Glucose, Bld: 114 mg/dL — ABNORMAL HIGH (ref 70–99)
Potassium: 3.3 mmol/L — ABNORMAL LOW (ref 3.5–5.1)
Sodium: 144 mmol/L (ref 135–145)

## 2020-09-29 LAB — CBC
HCT: 40 % (ref 36.0–46.0)
Hemoglobin: 13.3 g/dL (ref 12.0–15.0)
MCH: 30.6 pg (ref 26.0–34.0)
MCHC: 33.3 g/dL (ref 30.0–36.0)
MCV: 92 fL (ref 80.0–100.0)
Platelets: 214 10*3/uL (ref 150–400)
RBC: 4.35 MIL/uL (ref 3.87–5.11)
RDW: 12.3 % (ref 11.5–15.5)
WBC: 6.1 10*3/uL (ref 4.0–10.5)
nRBC: 0 % (ref 0.0–0.2)

## 2020-09-29 LAB — SURGICAL PCR SCREEN
MRSA, PCR: NEGATIVE
Staphylococcus aureus: NEGATIVE

## 2020-09-29 NOTE — Progress Notes (Addendum)
Anesthesia Review:  PCP: DR Kelton Pillar Called and requested most recent office visit note of 04/2020 along with labs done on same day.  LOV note of 04/2020 with labs on chart  Cardiologist : Chest x-ray : EKG :09/29/20 Echo : Stress test: Cardiac Cath :  Activity level: can do ao flight of stairs  without difficulty  Sleep Study/ CPAP : none  Fasting Blood Sugar :      / Checks Blood Sugar -- times a day:   Blood Thinner/ Instructions /Last Dose: ASA / Instructions/ Last Dose :  BMP done 4/13/20222 routed to Dr Kathrynn Speed.

## 2020-09-29 NOTE — Progress Notes (Deleted)
DUE TO COVID-19 ONLY ONE VISITOR IS ALLOWED TO COME WITH YOU AND STAY IN THE WAITING ROOM ONLY DURING PRE OP AND PROCEDURE DAY OF SURGERY. THE 1 VISITOR  MAY VISIT WITH YOU AFTER SURGERY IN YOUR PRIVATE ROOM DURING VISITING HOURS ONLY!  YOU NEED TO HAVE A COVID 19 TEST ON__4/19/2022 _____ @_______ , THIS TEST MUST BE DONE BEFORE SURGERY,  COVID TESTING SITE [address] [place] [address], IT IS ON THE RIGHT GOING OUT [address] APPROXIMATELY  2 MINUTES PAST [employer] ON THE RIGHT. ONCE YOUR COVID TEST IS COMPLETED,  PLEASE BEGIN THE QUARANTINE INSTRUCTIONS AS OUTLINED IN YOUR HANDOUT.                Nancy Lee  09/29/2020   Your procedure is scheduled on:  10/08/2020   Report to [facility] Main  Entrance   Report to admitting at    0715am      Call this number if you have problems the morning of surgery [phone]    REMEMBER: NO  SOLID FOOD CANDY OR GUM AFTER MIDNIGHT. CLEAR LIQUIDS UNTIL   0645am       . NOTHING BY MOUTH EXCEPT CLEAR LIQUIDS UNTIL     0645am   . PLEASE FINISH ENSURE DRINK PER SURGEON ORDER  WHICH NEEDS TO BE COMPLETED AT  0645am     .      CLEAR LIQUID DIET   Foods Allowed                                                                    Coffee and tea, regular and decaf                            Fruit ices (not with fruit pulp)                                      Iced Popsicles                                    Carbonated beverages, regular and diet                                    Cranberry, grape and apple juices Sports drinks like Gatorade Lightly seasoned clear broth or consume(fat free) Sugar, honey syrup ___________________________________________________________________      BRUSH YOUR TEETH MORNING OF SURGERY AND RINSE YOUR MOUTH OUT, NO CHEWING GUM CANDY OR MINTS.     Take these medicines the morning of surgery with A SIP OF WATER: effexor, prilosec, synthroid   DO NOT TAKE ANY DIABETIC  MEDICATIONS DAY OF YOUR SURGERY                               You may not have any metal on your body including hair pins and  piercings  Do not wear jewelry, make-up, lotions, powders or perfumes, deodorant             Do not wear nail polish on your fingernails.  Do not shave  48 hours prior to surgery.              Men may shave face and neck.   Do not bring valuables to the hospital. [place].  Contacts, dentures or bridgework may not be worn into surgery.  Leave suitcase in the car. After surgery it may be brought to your room.     Patients discharged the day of surgery will not be allowed to drive home. IF YOU ARE HAVING SURGERY AND GOING HOME THE SAME DAY, YOU MUST HAVE AN ADULT TO DRIVE YOU HOME AND BE WITH YOU FOR 24 HOURS. YOU MAY GO HOME BY TAXI OR UBER OR ORTHERWISE, BUT AN ADULT MUST ACCOMPANY YOU HOME AND STAY WITH YOU FOR 24 HOURS.  Name and phone number of your driver:  Special Instructions: N/A              Please read over the following fact sheets you were given: _____________________________________________________________________  [facility] - Preparing for Surgery Before surgery, you can play an important role.  Because skin is not sterile, your skin needs to be as free of germs as possible.  You can reduce the number of germs on your skin by washing with CHG (chlorahexidine gluconate) soap before surgery.  CHG is an antiseptic cleaner which kills germs and bonds with the skin to continue killing germs even after washing. Please DO NOT use if you have an allergy to CHG or antibacterial soaps.  If your skin becomes reddened/irritated stop using the CHG and inform your nurse when you arrive at Short Stay. Do not shave (including legs and underarms) for at least 48 hours prior to the first CHG shower.  You may shave your face/neck. Please follow these instructions carefully:  1.  Shower with CHG Soap the night  before surgery and the  morning of Surgery.  2.  If you choose to wash your hair, wash your hair first as usual with your  normal  shampoo.  3.  After you shampoo, rinse your hair and body thoroughly to remove the  shampoo.                           4.  Use CHG as you would any other liquid soap.  You can apply chg directly  to the skin and wash                       Gently with a scrungie or clean washcloth.  5.  Apply the CHG Soap to your body ONLY FROM THE NECK DOWN.   Do not use on face/ open                           Wound or open sores. Avoid contact with eyes, ears mouth and genitals (private parts).                       Wash face,  Genitals (private parts) with your normal soap.             6.  Wash thoroughly, paying special attention to the area where your surgery  will be performed.  7.  Thoroughly rinse your body with warm water from the neck down.  8.  DO NOT shower/wash with your normal soap after using and rinsing off  the CHG Soap.                9.  Pat yourself dry with a clean towel.            10.  Wear clean pajamas.            11.  Place clean sheets on your bed the night of your first shower and do not  sleep with pets. Day of Surgery : Do not apply any lotions/deodorants the morning of surgery.  Please wear clean clothes to the hospital/surgery center.  FAILURE TO FOLLOW THESE INSTRUCTIONS MAY RESULT IN THE CANCELLATION OF YOUR SURGERY PATIENT SIGNATURE_________________________________  NURSE SIGNATURE__________________________________  ________________________________________________________________________

## 2020-10-04 ENCOUNTER — Other Ambulatory Visit: Payer: Self-pay | Admitting: Orthopaedic Surgery

## 2020-10-05 ENCOUNTER — Other Ambulatory Visit (HOSPITAL_COMMUNITY)
Admission: RE | Admit: 2020-10-05 | Discharge: 2020-10-05 | Disposition: A | Discharge status: Home / Self Care | Payer: Medicare Other | Source: Ambulatory Visit | Attending: Orthopaedic Surgery | Admitting: Orthopaedic Surgery

## 2020-10-05 ENCOUNTER — Telehealth: Payer: Self-pay | Admitting: *Deleted

## 2020-10-05 DIAGNOSIS — Z20822 Contact with and (suspected) exposure to covid-19: Secondary | ICD-10-CM | POA: Diagnosis not present

## 2020-10-05 DIAGNOSIS — Z01812 Encounter for preprocedural laboratory examination: Secondary | ICD-10-CM | POA: Diagnosis present

## 2020-10-05 LAB — SARS CORONAVIRUS 2 (TAT 6-24 HRS): SARS Coronavirus 2: NEGATIVE

## 2020-10-05 NOTE — Telephone Encounter (Signed)
Attempted Ortho bundle call. Left VM on patient's home number requesting call back.

## 2020-10-06 ENCOUNTER — Telehealth: Payer: Self-pay | Admitting: *Deleted

## 2020-10-06 ENCOUNTER — Other Ambulatory Visit: Payer: Self-pay | Admitting: *Deleted

## 2020-10-06 DIAGNOSIS — M1711 Unilateral primary osteoarthritis, right knee: Secondary | ICD-10-CM

## 2020-10-06 NOTE — Telephone Encounter (Signed)
Ortho bundle pre-op call completed. 

## 2020-10-06 NOTE — Care Plan (Addendum)
RNCM call to patient to discuss her upcoming Right total knee replacement with Dr. Ninfa Linden on Friday, 10/08/20. Patient is an Ortho bundle through [facility] and is agreeable to case management. She is planning on going home after discharge with her spouse, who will be assisting. She has elevated toilets in the home, but will need a FWW. Referral made to [facility] for delivery to hospital prior to discharge. Anticipate HHPT will be needed after short hospital stay. Choice provided and referral made to [facility] ([facility]). F/U post op appointment with Dr. Ninfa Linden on 10/21/20 at 3:15 pm. Her first OPPT is scheduled with [facility] (Dr. Trevor Mace office) on 10/21/20 at 2:30 pm. Reviewed all post op care instructions and these were mailed to her home address as well. Will continue to follow for needs.

## 2020-10-07 NOTE — H&P (Signed)
TOTAL KNEE ADMISSION H&P  Patient is being admitted for right total knee arthroplasty.  Subjective:  Chief Complaint:right knee pain.  HPI: Nancy Lee, 65 y.o. female, has a history of pain and functional disability in the right knee due to arthritis and has failed non-surgical conservative treatments for greater than 12 weeks to includeNSAID's and/or analgesics, corticosteriod injections, viscosupplementation injections, flexibility and strengthening excercises, use of assistive devices, weight reduction as appropriate and activity modification.  Onset of symptoms was gradual, starting 4 years ago with gradually worsening course since that time. The patient noted prior procedures on the knee to include  arthroscopy on the right knee(s).  Patient currently rates pain in the right knee(s) at 10 out of 10 with activity. Patient has night pain, worsening of pain with activity and weight bearing, pain that interferes with activities of daily living, pain with passive range of motion, crepitus and joint swelling.  Patient has evidence of subchondral sclerosis, periarticular osteophytes and joint space narrowing by imaging studies. There is no active infection.  Patient Active Problem List   Diagnosis Date Noted  . Unilateral primary osteoarthritis, right knee 02/13/2018  . Status post arthroscopy of right knee 02/13/2018  . Old complex tear of lateral meniscus of right knee 08/08/2017  . Chronic pain of right knee 03/05/2017   Past Medical History:  Diagnosis Date  . Allergy   . Arthritis   . GERD (gastroesophageal reflux disease)   . History of kidney stones   . Hyperlipidemia   . Hypertension   . Hypothyroidism     Past Surgical History:  Procedure Laterality Date  . ABDOMINAL HYSTERECTOMY  2000   fibroids  . BUNIONECTOMY    . CHOLECYSTECTOMY  2007   gallstones  . DILATION AND CURETTAGE OF UTERUS    . DILATION AND CURETTE    . THYROIDECTOMY  05/02/2011   Procedure:  THYROIDECTOMY;  Surgeon: Joyice Faster. Cornett, MD;  Location: [facility];  Service: General;  Laterality: Bilateral;  . TONSILLECTOMY     as a child  . TONSILLECTOMY AND ADENOIDECTOMY    . TOTAL THYROIDECTOMY  05/02/2011    No current facility-administered medications for this encounter.   Current Outpatient Medications  Medication Sig Dispense Refill Last Dose  . aspirin 81 MG tablet Take 81 mg by mouth daily.     . calcium carbonate (OSCAL) 1500 (600 Ca) MG TABS tablet Take 600 mg of elemental calcium by mouth daily.     . calcium citrate-vitamin D (CITRACAL+D) 315-200 MG-UNIT per tablet Take 1 tablet by mouth daily.     . Cholecalciferol (VITAMIN D) 50 MCG (2000 UT) CAPS Take 2,000 Units by mouth daily.     Marland Kitchen EPINEPHrine (EPIPEN 2-PAK) 0.3 mg/0.3 mL IJ SOAJ injection Inject 0.3 mg into the muscle as needed for anaphylaxis.     . hydrochlorothiazide (MICROZIDE) 12.5 MG capsule Take 12.5 mg by mouth daily.     . hydrOXYzine (ATARAX/VISTARIL) 25 MG tablet Take 25 mg by mouth 3 (three) times daily as needed for itching.     . levothyroxine (SYNTHROID, LEVOTHROID) 125 MCG tablet Take by mouth daily before breakfast.     . Multiple Vitamin (MULTIVITAMIN) capsule Take 1 capsule by mouth daily.     Marland Kitchen omeprazole (PRILOSEC) 40 MG capsule Take 40 mg by mouth daily.     . simvastatin (ZOCOR) 20 MG tablet Take 20 mg by mouth daily.     . vitamin E 180 MG (400 UNITS) capsule Take 400  Units by mouth daily.     . diclofenac (VOLTAREN) 75 MG EC tablet TAKE 1 TABLET BY MOUTH TWICE A DAY WITH FOOD AS NEEDED FOR PAIN/INFLAMMATION AFTER STEROID COMPLETED 60 tablet 2   . diclofenac sodium (VOLTAREN) 1 % GEL Apply 1 application topically 4 (four) times daily. (Patient not taking: No sig reported) 100 g 0 Not Taking at Unknown time  . HYDROcodone-acetaminophen (NORCO/VICODIN) 5-325 MG tablet Take 1 tablet by mouth every 6 (six) hours as needed for moderate pain. for pain (Patient not taking: No sig reported) 30 tablet 0  Not Taking at Unknown time  . levothyroxine (SYNTHROID, LEVOTHROID) 100 MCG tablet Take 1 tablet (100 mcg total) by mouth daily before breakfast. (Patient not taking: Reported on 09/23/2020) 90 tablet 3 Not Taking at Unknown time   Allergies  Allergen Reactions  . Other     [name]   . Penicillins Rash    Pt. Reports PCN cream but not PCN injections.    Social History   Tobacco Use  . Smoking status: Former Smoker    Packs/day: 0.25    Years: 2.00    Pack years: 0.50    Types: Cigarettes    Quit date: 04/26/2007    Years since quitting: 13.4  . Smokeless tobacco: Never Used  Substance Use Topics  . Alcohol use: Never    Alcohol/week: 2.0 standard drinks    Types: 2 Glasses of wine per week    Family History  Problem Relation Age of Onset  . Hypertension Mother   . Breast cancer Mother   . Alcohol abuse Father   . Coronary artery disease Other      Review of Systems  Musculoskeletal: Positive for joint swelling.  All other systems reviewed and are negative.   Objective:  Physical Exam Vitals reviewed.  Constitutional:      Appearance: Normal appearance.  HENT:     Head: Normocephalic and atraumatic.  Eyes:     Extraocular Movements: Extraocular movements intact.  Cardiovascular:     Rate and Rhythm: Normal rate.     Pulses: Normal pulses.  Pulmonary:     Effort: Pulmonary effort is normal.  Abdominal:     Palpations: Abdomen is soft.  Musculoskeletal:     Cervical back: Normal range of motion.     Right knee: Effusion and bony tenderness present. Decreased range of motion. Tenderness present over the lateral joint line. Abnormal alignment and abnormal meniscus.  Neurological:     Mental Status: She is alert and oriented to person, place, and time.  Psychiatric:        Behavior: Behavior normal.     Vital signs in last 24 hours:    Labs:   Estimated body mass index is 30.54 kg/m as calculated from the following:   Height as of  01/01/20: 5\' 3"  (1.6 m).   Weight as of 01/01/20: 78.2 kg.   Imaging Review Plain radiographs demonstrate severe degenerative joint disease of the right knee(s). The overall alignment isneutral. The bone quality appears to be good for age and reported activity level.      Assessment/Plan:  End stage arthritis, right knee   The patient history, physical examination, clinical judgment of the provider and imaging studies are consistent with end stage degenerative joint disease of the right knee(s) and total knee arthroplasty is deemed medically necessary. The treatment options including medical management, injection therapy arthroscopy and arthroplasty were discussed at length. The risks and benefits of  total knee arthroplasty were presented and reviewed. The risks due to aseptic loosening, infection, stiffness, patella tracking problems, thromboembolic complications and other imponderables were discussed. The patient acknowledged the explanation, agreed to proceed with the plan and consent was signed. Patient is being admitted for inpatient treatment for surgery, pain control, PT, OT, prophylactic antibiotics, VTE prophylaxis, progressive ambulation and ADL's and discharge planning. The patient is planning to be discharged home with home health services

## 2020-10-08 ENCOUNTER — Observation Stay (HOSPITAL_COMMUNITY)
Admission: RE | Admit: 2020-10-08 | Discharge: 2020-10-10 | Disposition: A | Discharge status: Home / Self Care | Payer: Medicare Other | Attending: Orthopaedic Surgery | Admitting: Orthopaedic Surgery

## 2020-10-08 ENCOUNTER — Encounter (HOSPITAL_COMMUNITY)
Admission: RE | Disposition: A | Discharge status: Home / Self Care | Payer: Self-pay | Source: Home / Self Care | Attending: Orthopaedic Surgery

## 2020-10-08 ENCOUNTER — Ambulatory Visit (HOSPITAL_COMMUNITY): Payer: Medicare Other | Admitting: Physician Assistant

## 2020-10-08 ENCOUNTER — Ambulatory Visit (HOSPITAL_COMMUNITY): Payer: Medicare Other | Admitting: Certified Registered"

## 2020-10-08 ENCOUNTER — Observation Stay (HOSPITAL_COMMUNITY): Payer: Medicare Other

## 2020-10-08 DIAGNOSIS — E785 Hyperlipidemia, unspecified: Secondary | ICD-10-CM | POA: Diagnosis not present

## 2020-10-08 DIAGNOSIS — Z79899 Other long term (current) drug therapy: Secondary | ICD-10-CM | POA: Diagnosis not present

## 2020-10-08 DIAGNOSIS — K219 Gastro-esophageal reflux disease without esophagitis: Secondary | ICD-10-CM | POA: Insufficient documentation

## 2020-10-08 DIAGNOSIS — I10 Essential (primary) hypertension: Secondary | ICD-10-CM | POA: Diagnosis not present

## 2020-10-08 DIAGNOSIS — Z96651 Presence of right artificial knee joint: Secondary | ICD-10-CM

## 2020-10-08 DIAGNOSIS — Z87891 Personal history of nicotine dependence: Secondary | ICD-10-CM | POA: Insufficient documentation

## 2020-10-08 DIAGNOSIS — M1711 Unilateral primary osteoarthritis, right knee: Secondary | ICD-10-CM | POA: Diagnosis present

## 2020-10-08 DIAGNOSIS — E039 Hypothyroidism, unspecified: Secondary | ICD-10-CM | POA: Diagnosis not present

## 2020-10-08 HISTORY — PX: TOTAL KNEE ARTHROPLASTY: SHX125

## 2020-10-08 LAB — TYPE AND SCREEN
ABO/RH(D): O POS
Antibody Screen: NEGATIVE

## 2020-10-08 LAB — ABO/RH: ABO/RH(D): O POS

## 2020-10-08 SURGERY — ARTHROPLASTY, KNEE, TOTAL
Anesthesia: Spinal | Site: Knee | Laterality: Right

## 2020-10-08 MED ORDER — LIDOCAINE 2% (20 MG/ML) 5 ML SYRINGE
INTRAMUSCULAR | Status: AC
  Filled 2020-10-08: qty 5

## 2020-10-08 MED ORDER — DEXAMETHASONE SODIUM PHOSPHATE 10 MG/ML IJ SOLN
INTRAMUSCULAR | Status: AC
  Filled 2020-10-08: qty 1

## 2020-10-08 MED ORDER — OXYCODONE HCL 5 MG/5ML PO SOLN
5.0000 mg | Freq: Once | ORAL | Status: DC | PRN
Start: 2020-10-08 — End: 2020-10-08

## 2020-10-08 MED ORDER — STERILE WATER FOR IRRIGATION IR SOLN
Status: DC | PRN
  Administered 2020-10-08: 2000 mL

## 2020-10-08 MED ORDER — HYDROMORPHONE HCL 1 MG/ML IJ SOLN
0.5000 mg | INTRAMUSCULAR | Status: DC | PRN
Start: 2020-10-08 — End: 2020-10-08
  Administered 2020-10-08: 1 mg via INTRAVENOUS
  Filled 2020-10-08: qty 1

## 2020-10-08 MED ORDER — OXYCODONE HCL 5 MG PO TABS
10.0000 mg | ORAL_TABLET | ORAL | Status: DC | PRN
  Administered 2020-10-08 (×2): 10 mg via ORAL
  Administered 2020-10-09: 15 mg via ORAL
  Administered 2020-10-09: 10 mg via ORAL
  Filled 2020-10-08: qty 2
  Filled 2020-10-08: qty 3

## 2020-10-08 MED ORDER — HYDROXYZINE HCL 25 MG PO TABS: 25.0000 mg | ORAL_TABLET | Freq: Three times a day (TID) | ORAL | Status: DC | PRN

## 2020-10-08 MED ORDER — VITAMIN E 45 MG (100 UNIT) PO CAPS
400.0000 [IU] | ORAL_CAPSULE | Freq: Every day | ORAL | Status: DC
  Administered 2020-10-08 – 2020-10-10 (×3): 400 [IU] via ORAL
  Filled 2020-10-08 (×3): qty 4

## 2020-10-08 MED ORDER — FENTANYL CITRATE (PF) 100 MCG/2ML IJ SOLN
50.0000 ug | INTRAMUSCULAR | Status: DC
  Administered 2020-10-08: 50 ug via INTRAVENOUS
  Filled 2020-10-08: qty 2

## 2020-10-08 MED ORDER — DIPHENHYDRAMINE HCL 12.5 MG/5ML PO ELIX: 12.5000 mg | ORAL_SOLUTION | ORAL | Status: DC | PRN

## 2020-10-08 MED ORDER — METHOCARBAMOL 500 MG PO TABS
500.0000 mg | ORAL_TABLET | Freq: Four times a day (QID) | ORAL | Status: DC | PRN
  Administered 2020-10-09 (×2): 500 mg via ORAL
  Filled 2020-10-08 (×2): qty 1

## 2020-10-08 MED ORDER — OXYCODONE HCL 5 MG PO TABS
5.0000 mg | ORAL_TABLET | ORAL | Status: DC | PRN
  Filled 2020-10-08 (×2): qty 2

## 2020-10-08 MED ORDER — SODIUM CHLORIDE 0.9 % IV SOLN
1.0000 g | Freq: Four times a day (QID) | INTRAVENOUS | Status: AC
  Administered 2020-10-08 – 2020-10-09 (×2): 1 g via INTRAVENOUS
  Filled 2020-10-08 (×2): qty 1

## 2020-10-08 MED ORDER — ONDANSETRON HCL 4 MG PO TABS: 4.0000 mg | ORAL_TABLET | Freq: Four times a day (QID) | ORAL | Status: DC | PRN

## 2020-10-08 MED ORDER — BUPIVACAINE-EPINEPHRINE (PF) 0.25% -1:200000 IJ SOLN
INTRAMUSCULAR | Status: AC
  Filled 2020-10-08: qty 30

## 2020-10-08 MED ORDER — ASPIRIN 81 MG PO CHEW
81.0000 mg | CHEWABLE_TABLET | Freq: Two times a day (BID) | ORAL | Status: DC
  Administered 2020-10-08 – 2020-10-10 (×4): 81 mg via ORAL
  Filled 2020-10-08 (×4): qty 1

## 2020-10-08 MED ORDER — CEFAZOLIN SODIUM-DEXTROSE 2-4 GM/100ML-% IV SOLN
INTRAVENOUS | Status: AC
  Filled 2020-10-08: qty 100

## 2020-10-08 MED ORDER — MIDAZOLAM HCL 2 MG/2ML IJ SOLN
INTRAMUSCULAR | Status: AC
  Filled 2020-10-08: qty 2

## 2020-10-08 MED ORDER — 0.9 % SODIUM CHLORIDE (POUR BTL) OPTIME
TOPICAL | Status: DC | PRN
  Administered 2020-10-08: 1000 mL

## 2020-10-08 MED ORDER — PANTOPRAZOLE SODIUM 40 MG PO TBEC
40.0000 mg | DELAYED_RELEASE_TABLET | Freq: Every day | ORAL | Status: DC
  Administered 2020-10-08 – 2020-10-10 (×3): 40 mg via ORAL
  Filled 2020-10-08 (×3): qty 1

## 2020-10-08 MED ORDER — KETOROLAC TROMETHAMINE 15 MG/ML IJ SOLN
7.5000 mg | Freq: Four times a day (QID) | INTRAMUSCULAR | Status: AC
  Administered 2020-10-08 – 2020-10-09 (×4): 7.5 mg via INTRAVENOUS
  Filled 2020-10-08 (×4): qty 1

## 2020-10-08 MED ORDER — PHENOL 1.4 % MT LIQD: 1.0000 | OROMUCOSAL | Status: DC | PRN

## 2020-10-08 MED ORDER — FENTANYL CITRATE (PF) 100 MCG/2ML IJ SOLN
INTRAMUSCULAR | Status: DC | PRN
  Administered 2020-10-08: 25 ug via INTRAVENOUS

## 2020-10-08 MED ORDER — MIDAZOLAM HCL 2 MG/2ML IJ SOLN
INTRAMUSCULAR | Status: DC | PRN
  Administered 2020-10-08 (×2): 1 mg via INTRAVENOUS

## 2020-10-08 MED ORDER — TRANEXAMIC ACID-NACL 1000-0.7 MG/100ML-% IV SOLN
1000.0000 mg | INTRAVENOUS | Status: AC
  Administered 2020-10-08: 1000 mg via INTRAVENOUS
  Filled 2020-10-08: qty 100

## 2020-10-08 MED ORDER — HYDROCHLOROTHIAZIDE 12.5 MG PO CAPS
12.5000 mg | ORAL_CAPSULE | Freq: Every day | ORAL | Status: DC
  Administered 2020-10-09 – 2020-10-10 (×2): 12.5 mg via ORAL
  Filled 2020-10-08 (×2): qty 1

## 2020-10-08 MED ORDER — BUPIVACAINE IN DEXTROSE 0.75-8.25 % IT SOLN
INTRATHECAL | Status: DC | PRN
  Administered 2020-10-08: 1.6 mL via INTRATHECAL

## 2020-10-08 MED ORDER — ONDANSETRON HCL 4 MG/2ML IJ SOLN
4.0000 mg | Freq: Four times a day (QID) | INTRAMUSCULAR | Status: DC | PRN
  Administered 2020-10-08: 4 mg via INTRAVENOUS
  Filled 2020-10-08: qty 2

## 2020-10-08 MED ORDER — MIDAZOLAM HCL 2 MG/2ML IJ SOLN
1.0000 mg | INTRAMUSCULAR | Status: DC
  Administered 2020-10-08: 1 mg via INTRAVENOUS
  Filled 2020-10-08: qty 2

## 2020-10-08 MED ORDER — MENTHOL 3 MG MT LOZG: 1.0000 | LOZENGE | OROMUCOSAL | Status: DC | PRN

## 2020-10-08 MED ORDER — PHENYLEPHRINE HCL-NACL 10-0.9 MG/250ML-% IV SOLN
INTRAVENOUS | Status: DC | PRN
  Administered 2020-10-08: 20 ug/min via INTRAVENOUS

## 2020-10-08 MED ORDER — SODIUM CHLORIDE 0.9 % IV SOLN: INTRAVENOUS | Status: DC

## 2020-10-08 MED ORDER — METOCLOPRAMIDE HCL 5 MG/ML IJ SOLN
5.0000 mg | Freq: Three times a day (TID) | INTRAMUSCULAR | Status: DC | PRN
  Administered 2020-10-08: 10 mg via INTRAVENOUS
  Filled 2020-10-08: qty 2

## 2020-10-08 MED ORDER — ACETAMINOPHEN 325 MG PO TABS
325.0000 mg | ORAL_TABLET | Freq: Four times a day (QID) | ORAL | Status: DC | PRN
  Administered 2020-10-08: 650 mg via ORAL
  Filled 2020-10-08: qty 2

## 2020-10-08 MED ORDER — METOCLOPRAMIDE HCL 5 MG PO TABS: 5.0000 mg | ORAL_TABLET | Freq: Three times a day (TID) | ORAL | Status: DC | PRN

## 2020-10-08 MED ORDER — SODIUM CHLORIDE 0.9 % IR SOLN
Status: DC | PRN
  Administered 2020-10-08: 1000 mL

## 2020-10-08 MED ORDER — CEFAZOLIN SODIUM-DEXTROSE 2-3 GM-%(50ML) IV SOLR
INTRAVENOUS | Status: DC | PRN
  Administered 2020-10-08: 2 g via INTRAVENOUS

## 2020-10-08 MED ORDER — ONDANSETRON HCL 4 MG/2ML IJ SOLN
INTRAMUSCULAR | Status: AC
  Filled 2020-10-08: qty 2

## 2020-10-08 MED ORDER — LEVOTHYROXINE SODIUM 125 MCG PO TABS
125.0000 ug | ORAL_TABLET | Freq: Every day | ORAL | Status: DC
  Administered 2020-10-09: 125 ug via ORAL
  Filled 2020-10-08 (×2): qty 1

## 2020-10-08 MED ORDER — OXYCODONE HCL 5 MG PO TABS
5.0000 mg | ORAL_TABLET | Freq: Once | ORAL | Status: DC | PRN
Start: 2020-10-08 — End: 2020-10-08

## 2020-10-08 MED ORDER — HYDROMORPHONE HCL 1 MG/ML IJ SOLN
0.5000 mg | INTRAMUSCULAR | Status: DC | PRN
  Administered 2020-10-08 – 2020-10-09 (×4): 1 mg via INTRAVENOUS
  Filled 2020-10-08 (×4): qty 1

## 2020-10-08 MED ORDER — LACTATED RINGERS IV SOLN: INTRAVENOUS | Status: DC

## 2020-10-08 MED ORDER — ORAL CARE MOUTH RINSE
15.0000 mL | Freq: Once | OROMUCOSAL | Status: AC
  Administered 2020-10-08: 15 mL via OROMUCOSAL

## 2020-10-08 MED ORDER — BUPIVACAINE-EPINEPHRINE 0.25% -1:200000 IJ SOLN
INTRAMUSCULAR | Status: DC | PRN
  Administered 2020-10-08: 30 mL

## 2020-10-08 MED ORDER — PROPOFOL 500 MG/50ML IV EMUL
INTRAVENOUS | Status: DC | PRN
  Administered 2020-10-08: 70 ug/kg/min via INTRAVENOUS

## 2020-10-08 MED ORDER — CLINDAMYCIN PHOSPHATE 900 MG/50ML IV SOLN
900.0000 mg | INTRAVENOUS | Status: DC
  Filled 2020-10-08: qty 50

## 2020-10-08 MED ORDER — ONDANSETRON HCL 4 MG/2ML IJ SOLN
INTRAMUSCULAR | Status: DC | PRN
  Administered 2020-10-08: 4 mg via INTRAVENOUS

## 2020-10-08 MED ORDER — FENTANYL CITRATE (PF) 100 MCG/2ML IJ SOLN: 25.0000 ug | INTRAMUSCULAR | Status: DC | PRN

## 2020-10-08 MED ORDER — FENTANYL CITRATE (PF) 100 MCG/2ML IJ SOLN
INTRAMUSCULAR | Status: AC
  Filled 2020-10-08: qty 2

## 2020-10-08 MED ORDER — DEXAMETHASONE SODIUM PHOSPHATE 10 MG/ML IJ SOLN
INTRAMUSCULAR | Status: DC | PRN
  Administered 2020-10-08: 8 mg via INTRAVENOUS

## 2020-10-08 MED ORDER — ROPIVACAINE HCL 7.5 MG/ML IJ SOLN
INTRAMUSCULAR | Status: DC | PRN
  Administered 2020-10-08: 20 mL via PERINEURAL

## 2020-10-08 MED ORDER — ONDANSETRON HCL 4 MG/2ML IJ SOLN: 4.0000 mg | Freq: Once | INTRAMUSCULAR | Status: DC | PRN

## 2020-10-08 MED ORDER — POVIDONE-IODINE 10 % EX SWAB
2.0000 "application " | Freq: Once | CUTANEOUS | Status: AC
  Administered 2020-10-08: 2 via TOPICAL

## 2020-10-08 MED ORDER — DOCUSATE SODIUM 100 MG PO CAPS
100.0000 mg | ORAL_CAPSULE | Freq: Two times a day (BID) | ORAL | Status: DC
  Administered 2020-10-08 – 2020-10-10 (×4): 100 mg via ORAL
  Filled 2020-10-08 (×4): qty 1

## 2020-10-08 MED ORDER — CEFAZOLIN SODIUM-DEXTROSE 1-4 GM/50ML-% IV SOLN
1.0000 g | Freq: Four times a day (QID) | INTRAVENOUS | Status: DC
  Administered 2020-10-08: 1 g via INTRAVENOUS
  Filled 2020-10-08 (×3): qty 50

## 2020-10-08 MED ORDER — PROPOFOL 1000 MG/100ML IV EMUL
INTRAVENOUS | Status: AC
  Filled 2020-10-08: qty 100

## 2020-10-08 MED ORDER — SIMVASTATIN 20 MG PO TABS
20.0000 mg | ORAL_TABLET | Freq: Every day | ORAL | Status: DC
  Administered 2020-10-08 – 2020-10-10 (×3): 20 mg via ORAL
  Filled 2020-10-08 (×3): qty 1

## 2020-10-08 MED ORDER — VITAMIN D 25 MCG (1000 UNIT) PO TABS
2000.0000 [IU] | ORAL_TABLET | Freq: Every day | ORAL | Status: DC
  Administered 2020-10-08 – 2020-10-10 (×3): 2000 [IU] via ORAL
  Filled 2020-10-08 (×3): qty 2

## 2020-10-08 MED ORDER — CHLORHEXIDINE GLUCONATE 0.12 % MT SOLN: 15.0000 mL | Freq: Once | OROMUCOSAL | Status: AC

## 2020-10-08 MED ORDER — METHOCARBAMOL 1000 MG/10ML IJ SOLN
500.0000 mg | Freq: Four times a day (QID) | INTRAVENOUS | Status: DC | PRN
  Administered 2020-10-08: 500 mg via INTRAVENOUS
  Filled 2020-10-08: qty 5
  Filled 2020-10-08: qty 500

## 2020-10-08 MED ORDER — PHENYLEPHRINE 40 MCG/ML (10ML) SYRINGE FOR IV PUSH (FOR BLOOD PRESSURE SUPPORT)
PREFILLED_SYRINGE | INTRAVENOUS | Status: DC | PRN
  Administered 2020-10-08 (×6): 40 ug via INTRAVENOUS

## 2020-10-08 MED ORDER — ALUM & MAG HYDROXIDE-SIMETH 200-200-20 MG/5ML PO SUSP: 30.0000 mL | ORAL | Status: DC | PRN

## 2020-10-08 SURGICAL SUPPLY — 61 items
APL SKNCLS STERI-STRIP NONHPOA (GAUZE/BANDAGES/DRESSINGS)
BAG SPEC THK2 15X12 ZIP CLS (MISCELLANEOUS) ×1
BAG ZIPLOCK 12X15 (MISCELLANEOUS) ×2 IMPLANT
BEARIN INSERT TIBIAL SZ 3 11 (Insert) ×2 IMPLANT
BEARING INSERT TIBIAL SZ 3 11 (Insert) IMPLANT
BENZOIN TINCTURE PRP APPL 2/3 (GAUZE/BANDAGES/DRESSINGS) IMPLANT
BLADE SAG 18X100X1.27 (BLADE) ×2 IMPLANT
BLADE SURG SZ10 CARB STEEL (BLADE) ×4 IMPLANT
BNDG CMPR MED 10X6 ELC LF (GAUZE/BANDAGES/DRESSINGS) ×2
BNDG ELASTIC 6X10 VLCR STRL LF (GAUZE/BANDAGES/DRESSINGS) ×2 IMPLANT
BNDG ELASTIC 6X5.8 VLCR STR LF (GAUZE/BANDAGES/DRESSINGS) ×4 IMPLANT
BOWL SMART MIX CTS (DISPOSABLE) IMPLANT
BSPLAT TIB 3 KN TRITANIUM (Knees) ×1 IMPLANT
COOLER ICEMAN CLASSIC (MISCELLANEOUS) ×2 IMPLANT
COVER SURGICAL LIGHT HANDLE (MISCELLANEOUS) ×2 IMPLANT
COVER WAND RF STERILE (DRAPES) IMPLANT
CUFF TOURN SGL QUICK 34 (TOURNIQUET CUFF) ×2
CUFF TRNQT CYL 34X4.125X (TOURNIQUET CUFF) ×1 IMPLANT
DECANTER SPIKE VIAL GLASS SM (MISCELLANEOUS) IMPLANT
DRAPE U-SHAPE 47X51 STRL (DRAPES) ×2 IMPLANT
DRSG PAD ABDOMINAL 8X10 ST (GAUZE/BANDAGES/DRESSINGS) ×3 IMPLANT
DURAPREP 26ML APPLICATOR (WOUND CARE) ×2 IMPLANT
ELECT BLADE TIP CTD 4 INCH (ELECTRODE) ×2 IMPLANT
ELECT REM PT RETURN 15FT ADLT (MISCELLANEOUS) ×2 IMPLANT
FEMORAL POSTERIOR SZ3 RT (Joint) IMPLANT
GAUZE SPONGE 4X4 12PLY STRL (GAUZE/BANDAGES/DRESSINGS) ×2 IMPLANT
GAUZE XEROFORM 1X8 LF (GAUZE/BANDAGES/DRESSINGS) ×1 IMPLANT
GLOVE SRG 8 PF TXTR STRL LF DI (GLOVE) ×2 IMPLANT
GLOVE SURG ENC MOIS LTX SZ7.5 (GLOVE) ×2 IMPLANT
GLOVE SURG LTX SZ8 (GLOVE) ×2 IMPLANT
GLOVE SURG UNDER POLY LF SZ8 (GLOVE) ×4
GOWN STRL REUS W/TWL XL LVL3 (GOWN DISPOSABLE) ×4 IMPLANT
HANDPIECE INTERPULSE COAX TIP (DISPOSABLE) ×2
HOLDER FOLEY CATH W/STRAP (MISCELLANEOUS) IMPLANT
IMMOBILIZER KNEE 20 (SOFTGOODS) ×2
IMMOBILIZER KNEE 20 THIGH 36 (SOFTGOODS) ×1 IMPLANT
KIT TURNOVER KIT A (KITS) ×2 IMPLANT
KNEE PATELLA ASYMMETRIC 9X29 (Knees) ×1 IMPLANT
KNEE TIBIAL COMPONENT SZ3 (Knees) ×1 IMPLANT
NS IRRIG 1000ML POUR BTL (IV SOLUTION) ×2 IMPLANT
PACK TOTAL KNEE CUSTOM (KITS) ×2 IMPLANT
PAD COLD SHLDR WRAP-ON (PAD) ×2 IMPLANT
PADDING CAST COTTON 6X4 STRL (CAST SUPPLIES) ×4 IMPLANT
PADDING CAST SYN 6 (CAST SUPPLIES) ×1
PADDING CAST SYNTHETIC 6X4 NS (CAST SUPPLIES) IMPLANT
PENCIL SMOKE EVACUATOR (MISCELLANEOUS) IMPLANT
PIN FLUTED HEDLESS FIX 3.5X1/8 (PIN) ×1 IMPLANT
POSTERIOR FEMORAL SZ3 RT (Joint) ×2 IMPLANT
PROTECTOR NERVE ULNAR (MISCELLANEOUS) ×2 IMPLANT
SET HNDPC FAN SPRY TIP SCT (DISPOSABLE) ×1 IMPLANT
SET PAD KNEE POSITIONER (MISCELLANEOUS) ×2 IMPLANT
STAPLER VISISTAT 35W (STAPLE) IMPLANT
STRIP CLOSURE SKIN 1/2X4 (GAUZE/BANDAGES/DRESSINGS) IMPLANT
SUT MNCRL AB 4-0 PS2 18 (SUTURE) IMPLANT
SUT VIC AB 0 CT1 27 (SUTURE) ×2
SUT VIC AB 0 CT1 27XBRD ANTBC (SUTURE) ×1 IMPLANT
SUT VIC AB 1 CT1 36 (SUTURE) ×4 IMPLANT
SUT VIC AB 2-0 CT1 27 (SUTURE) ×4
SUT VIC AB 2-0 CT1 TAPERPNT 27 (SUTURE) ×2 IMPLANT
TRAY FOLEY MTR SLVR 16FR STAT (SET/KITS/TRAYS/PACK) IMPLANT
WATER STERILE IRR 1000ML POUR (IV SOLUTION) ×4 IMPLANT

## 2020-10-08 NOTE — Anesthesia Preprocedure Evaluation (Addendum)
Anesthesia Evaluation  Patient identified by MRN, date of birth, ID band Patient awake    Reviewed: Allergy & Precautions, NPO status , Patient's Chart, lab work & pertinent test results  History of Anesthesia Complications Negative for: history of anesthetic complications  Airway Mallampati: III  TM Distance: >3 FB Neck ROM: Full    Dental  (+) Dental Advisory Given, Teeth Intact   Pulmonary former smoker,    Pulmonary exam normal        Cardiovascular hypertension, Pt. on medications Normal cardiovascular exam     Neuro/Psych negative neurological ROS  negative psych ROS   GI/Hepatic Neg liver ROS, GERD  Medicated and Controlled,  Endo/Other  Hypothyroidism   Renal/GU negative Renal ROS     Musculoskeletal  (+) Arthritis ,   Abdominal   Peds  Hematology negative hematology ROS (+)  Plt 214k    Anesthesia Other Findings Covid test negative   Reproductive/Obstetrics                            Anesthesia Physical Anesthesia Plan  ASA: II  Anesthesia Plan: Spinal   Post-op Pain Management:  Regional for Post-op pain   Induction:   PONV Risk Score and Plan: 2 and Treatment may vary due to age or medical condition and Propofol infusion  Airway Management Planned: Natural Airway and Simple Face Mask  Additional Equipment: None  Intra-op Plan:   Post-operative Plan:   Informed Consent: I have reviewed the patients History and Physical, chart, labs and discussed the procedure including the risks, benefits and alternatives for the proposed anesthesia with the patient or authorized representative who has indicated his/her understanding and acceptance.       Plan Discussed with: CRNA and Anesthesiologist  Anesthesia Plan Comments: (Labs reviewed, platelets acceptable. Discussed risks and benefits of spinal, including spinal/epidural hematoma, infection, failed block, and PDPH.  Patient expressed understanding and wished to proceed. )       Anesthesia Quick Evaluation

## 2020-10-08 NOTE — Brief Op Note (Signed)
10/08/2020  11:40 AM  PATIENT:  Nancy Lee  65 y.o. female  PRE-OPERATIVE DIAGNOSIS:  osteoarthritis right knee  POST-OPERATIVE DIAGNOSIS:  osteoarthritis right knee  PROCEDURE:  Procedure(s) with comments: RIGHT TOTAL KNEE ARTHROPLASTY (Right) - Needs RNFA  SURGEON:  Surgeon(s) and Role:    Mcarthur Rossetti, MD - Primary  ANESTHESIA:   local, regional and spinal  EBL:  50 mL   COUNTS:  YES  TOURNIQUET:   Total Tourniquet Time Documented: Thigh (Right) - 37 minutes Total: Thigh (Right) - 37 minutes   DICTATION: .Other Dictation: Dictation Number [id number]  PLAN OF CARE: Admit for overnight observation  PATIENT DISPOSITION:  PACU - hemodynamically stable.   Delay start of Pharmacological VTE agent (>24hrs) due to surgical blood loss or risk of bleeding: no

## 2020-10-08 NOTE — Anesthesia Procedure Notes (Signed)
Spinal  Patient location during procedure: OR Start time: 10/08/2020 10:25 AM End time: 10/08/2020 10:27 AM Reason for block: surgical anesthesia Staffing Performed: anesthesiologist  Anesthesiologist: Audry Pili, MD Preanesthetic Checklist Completed: patient identified, IV checked, risks and benefits discussed, surgical consent, monitors and equipment checked, pre-op evaluation and timeout performed Spinal Block Patient position: sitting Prep: DuraPrep Patient monitoring: heart rate, cardiac monitor, continuous pulse ox and blood pressure Approach: midline Location: L2-3 Injection technique: single-shot Needle Needle type: Pencan  Needle gauge: 24 G Additional Notes Consent was obtained prior to the procedure with all questions answered and concerns addressed. Risks including, but not limited to, bleeding, infection, nerve damage, paralysis, failed block, inadequate analgesia, allergic reaction, high spinal, itching, and headache were discussed and the patient wished to proceed. Functioning IV was confirmed and monitors were applied. Sterile prep and drape, including hand hygiene, mask, and sterile gloves were used. The patient was positioned and the spine was prepped. The skin was anesthetized with lidocaine. Free flow of clear CSF was obtained prior to injecting local anesthetic into the CSF. The spinal needle aspirated freely following injection. The needle was carefully withdrawn. The patient tolerated the procedure well.   Renold Don, MD

## 2020-10-08 NOTE — Op Note (Signed)
NAME: Nancy Lee, Nancy Lee MEDICAL RECORD NO: [id number] ACCOUNT NO: [id number] DATE OF BIRTH: [birth year] FACILITY: [facility]ATION: [facility] PHYSICIAN: Lind Guest. Ninfa Linden, MD  Operative Report   DATE OF PROCEDURE: 10/08/2020  PREOPERATIVE DIAGNOSIS:  Primary osteoarthritis and degenerative joint disease, right knee.  POSTOPERATIVE DIAGNOSIS:  Primary osteoarthritis and degenerative joint disease, right knee.  PROCEDURE:  Right total knee arthroplasty.  IMPLANTS:  Stryker Triathlon press-fit knee system with size 3 femur, size 3 tibial tray, 11 mm fixed-bearing polyethylene insert, size 29 patellar button.  SURGEON:  Lind Guest. Ninfa Linden, MD  ANESTHESIA: 1.  Right lower extremity adductor canal block. 2.  Local with 0.25% Marcaine with epinephrine around the arthrotomy. 3.  Spinal.  ANTIBIOTICS:  2 g IV Ancef.  TOURNIQUET TIME:  Less than 1 hour.  BLOOD LOSS:  Less than 100 mL.  COMPLICATIONS:  None.  INDICATIONS:  The patient is a 65 year old female well known to me.  She has valgus malalignment of her right knee and severe bone-on-bone wear mainly at the lateral compartment of the knee.  She has had arthroscopic intervention and multiple steroid  injections.  At this point, her right knee pain is detrimentally affecting her mobility, her quality of life, and her activities of daily living.  Combined with this and the very conservative treatment, she does wish to proceed with a knee replacement.   She understands our goals are to decrease pain, improve mobility, and overall improve quality of life.  We talked about the risk of acute blood loss anemia, nerve or vessel injury, fracture, infection, DVT, implant failure, and skin and soft tissue  issues.  We talked about our goals being decreased pain, improved mobility, and overall improved quality of life.  PROCEDURE DESCRIPTION:  After informed consent was obtained, appropriate right knee was marked, and adductor canal block was  obtained in the right lower extremity in the holding room, she was then brought to the operating room and sat up on the operating  table where spinal anesthesia was obtained.  She was laid in supine position on the operating table.  Foley catheter was placed.  A nonsterile tourniquet was placed around her upper right thigh.  Her right thigh, knee, leg, and ankle were prepped and  draped with DuraPrep and sterile drapes including a sterile stockinette.  A timeout was called, and she was identified as correct patient, correct right knee.  We then used Esmarch to wrap out the leg and tourniquet was inflated to 300 mm pressure.  I  then made a direct midline incision over the patella and carried this proximally and distally.  I dissected down the knee joint, carried out a medial parapatellar arthrotomy finding a moderate joint effusion.  There was significant cartilage loss  throughout the knee, especially in the lateral compartment.  With the knee in a flexed position, we removed remnants of ACL, PCL, medial and lateral meniscus.  We set our extramedullary cutting guide for making our proximal tibia cut correcting for varus  and valgus in neutral slope and taking 9 mm off the high side.  We made this cut without difficulty.  We then used the intramedullary drill to open up the canal for alignment guide intramedullary for a distal femoral cut.  We set this for a right knee  at 5 degrees externally rotated for an 8 mm distal femoral cut.  We made this cut without difficulty and brought the knee back down to full extension including more debris from the back of the  knee.  With a 9 mm extension block, we had achieved full  extension.  We then went back to the femur and put our femoral sizing guide based off the epicondylar axis.  Based off of this, we chose a size 3 femur.  We put a 4-in-1 cutting block for a size 3 femur, made our anterior and posterior cuts followed by  our chamfer cuts.  We then made our femoral  box cut.  Attention was turned back to the tibia.  We chose a size 3 tibial tray for coverage setting the rotation of the tibial tubercle and the femur.  We chose a size 3 tibia and did our press-fit keel punch  for this.  We did feel like she had good quality bone for press-fit implants.  With a size 3 tibial trial followed by the 3 right femur tibial trial, we tried 9 mm fixed-bearing insert, and we felt like the real one should be an 11.  We then made our  patellar cut and drilled three holes for a size 29 patellar button.  We put the knee through several cycles of motion.  We were pleased with range of motion and stability.  We then removed all instrumentation from the knee and did the finishing size 3  block for the tibia.  We then irrigated the knee with normal saline solution using pulsatile lavage and dried it real well.  I placed Marcaine with epinephrine around the arthrotomy and again dried the knee.  With the knee in a flexed position, we then  placed our real Stryker Triathlon press-fit tibial component size 3 followed by real size 3 right press-fit femur.  We placed our real 11 mm fixed-bearing polyethylene insert and press fit our patellar button.  I then put the knee through several cycles  of motion.  We were pleased with range of motion and stability.  We then let the tourniquet down.  Hemostasis was obtained with electrocautery.  We then closed the arthrotomy with interrupted #1 Vicryl suture followed by 0 Vicryl to close deep tissue and  2-0 Vicryl to close subcutaneous tissue.  The skin was reapproximated with staples.  Xeroform and well-padded sterile dressing was applied.  She was taken to recovery room in stable condition with all final counts being correct.  No complications noted.   [facility] D: 10/08/2020 11:39:49 am T: 10/08/2020 5:00:00 pm  JOB: [id number]/ [id number]

## 2020-10-08 NOTE — Anesthesia Postprocedure Evaluation (Signed)
Anesthesia Post Note  Patient: Nancy Lee  Procedure(s) Performed: RIGHT TOTAL KNEE ARTHROPLASTY (Right Knee)     Patient location during evaluation: PACU Anesthesia Type: Spinal Level of consciousness: awake and alert Pain management: pain level controlled Vital Signs Assessment: post-procedure vital signs reviewed and stable Respiratory status: spontaneous breathing and respiratory function stable Cardiovascular status: blood pressure returned to baseline and stable Postop Assessment: spinal receding and no apparent nausea or vomiting Anesthetic complications: no   No complications documented.  Last Vitals:  Vitals:   10/08/20 1330 10/08/20 1347  BP: 111/67 114/74  Pulse: 62 79  Resp: 19 16  Temp: 36.7 C (!) 36.3 C  SpO2: 100% 100%    Last Pain:  Vitals:   10/08/20 1425  TempSrc:   PainSc: [place]

## 2020-10-08 NOTE — Anesthesia Procedure Notes (Signed)
Procedure Name: MAC Date/Time: 10/08/2020 10:24 AM Performed by: Eben Burow, CRNA Pre-anesthesia Checklist: Patient identified, Emergency Drugs available, Suction available, Patient being monitored and Timeout performed Oxygen Delivery Method: Simple face mask Placement Confirmation: positive ETCO2

## 2020-10-08 NOTE — Transfer of Care (Signed)
Immediate Anesthesia Transfer of Care Note  Patient: Nancy Lee  Procedure(s) Performed: RIGHT TOTAL KNEE ARTHROPLASTY (Right Knee)  Patient Location: PACU  Anesthesia Type:Spinal  Level of Consciousness: drowsy and patient cooperative  Airway & Oxygen Therapy: Patient Spontanous Breathing and Patient connected to face mask oxygen  Post-op Assessment: Report given to RN and Post -op Vital signs reviewed and stable  Post vital signs: Reviewed and stable  Last Vitals:  Vitals Value Taken Time  BP 96/60 10/08/20 1202  Temp    Pulse 83 10/08/20 1204  Resp 24 10/08/20 1204  SpO2 100 % 10/08/20 1204  Vitals shown include unvalidated device data.  Last Pain:  Vitals:   10/08/20 0735  TempSrc: Oral         Complications: No complications documented.

## 2020-10-08 NOTE — Anesthesia Procedure Notes (Signed)
Anesthesia Regional Block: Adductor canal block   Pre-Anesthetic Checklist: ,, timeout performed, Correct Patient, Correct Site, Correct Laterality, Correct Procedure, Correct Position, site marked, Risks and benefits discussed,  Surgical consent,  Pre-op evaluation,  At surgeon's request and post-op pain management  Laterality: Right  Prep: chloraprep       Needles:  Injection technique: Single-shot  Needle Type: Echogenic Needle     Needle Length: 10cm  Needle Gauge: 21     Additional Needles:   Narrative:  Start time: 10/08/2020 9:08 AM End time: 10/08/2020 9:11 AM Injection made incrementally with aspirations every 5 mL.  Performed by: Personally  Anesthesiologist: Audry Pili, MD  Additional Notes: No pain on injection. No increased resistance to injection. Injection made in 5cc increments. Good needle visualization. Patient tolerated the procedure well.

## 2020-10-08 NOTE — Interval H&P Note (Signed)
History and Physical Interval Note: The patient understands that she is here today for right total knee replacement to treat her right knee osteoarthritis.  The risks and benefits of surgery been explained in detail and informed consent is obtained.  The right knee has been marked.  There has been no interval or acute change in her medical status.  See recent H&P.  10/08/2020 8:57 AM  Nancy Lee  has presented today for surgery, with the diagnosis of osteoarthritis right knee.  The various methods of treatment have been discussed with the patient and family. After consideration of risks, benefits and other options for treatment, the patient has consented to  Procedure(s) with comments: RIGHT TOTAL KNEE ARTHROPLASTY (Right) - Needs RNFA as a surgical intervention.  The patient's history has been reviewed, patient examined, no change in status, stable for surgery.  I have reviewed the patient's chart and labs.  Questions were answered to the patient's satisfaction.     Mcarthur Rossetti

## 2020-10-08 NOTE — Evaluation (Signed)
Physical Therapy Evaluation Patient Details Name: Nancy Lee MRN: [id number] DOB: [birth year] Today's Date: 10/08/2020   History of Present Illness  Pt s/p R TKR  Clinical Impression  Pt s/p R TKR and presents with decreased R LE strength/ROM and post op pain limiting functional mobility.  Pt should progress to dc home with family assist.    Follow Up Recommendations Home health PT;Follow surgeon's recommendation for DC plan and follow-up therapies    Equipment Recommendations  Rolling walker with 5" wheels    Recommendations for Other Services       Precautions / Restrictions Precautions Precautions: Fall;Knee Required Braces or Orthoses: Knee Immobilizer - Right Knee Immobilizer - Right: Discontinue once straight leg raise with < 10 degree lag Restrictions Weight Bearing Restrictions: No Other Position/Activity Restrictions: WBAT      Mobility  Bed Mobility Overal bed mobility: Needs Assistance Bed Mobility: Supine to Sit     Supine to sit: Min assist     General bed mobility comments: cues for sequence and assist for R LE management    Transfers Overall transfer level: Needs assistance Equipment used: Rolling walker (2 wheeled) Transfers: Sit to/from Stand Sit to Stand: Min assist;Mod assist         General transfer comment: cues for LE management and use of UEs to self assist  Ambulation/Gait Ambulation/Gait assistance: Min assist Gait Distance (Feet): 34 Feet Assistive device: Rolling walker (2 wheeled) Gait Pattern/deviations: Step-to pattern Gait velocity: decr   General Gait Details: cues for sequence, posture and position from [employer]            Wheelchair Mobility    Modified Rankin (Stroke Patients Only)       Balance Overall balance assessment: Needs assistance Sitting-balance support: No upper extremity supported;Feet supported Sitting balance-Leahy Scale: Fair     Standing balance support: Bilateral upper extremity  supported Standing balance-Leahy Scale: Poor                               Pertinent Vitals/Pain Pain Assessment: 0-10 Pain Score: 5  Pain Location: R knee Pain Descriptors / Indicators: Aching;Sore Pain Intervention(s): Limited activity within patient's tolerance;Monitored during session;Premedicated before session;Ice applied    Home Living Family/patient expects to be discharged to:: Private residence Living Arrangements: Spouse/significant other Available Help at Discharge: Family Type of Home: House Home Access: Stairs to enter Entrance Stairs-Rails: None Technical brewer of Steps: 2 Home Layout: One level Home Equipment: None      Prior Function Level of Independence: Independent               Hand Dominance        Extremity/Trunk Assessment   Upper Extremity Assessment Upper Extremity Assessment: Overall WFL for tasks assessed    Lower Extremity Assessment Lower Extremity Assessment: RLE deficits/detail    Cervical / Trunk Assessment Cervical / Trunk Assessment: Normal  Communication   Communication: No difficulties  Cognition Arousal/Alertness: Awake/alert Behavior During Therapy: WFL for tasks assessed/performed Overall Cognitive Status: Within Functional Limits for tasks assessed                                        General Comments      Exercises Total Joint Exercises Ankle Circles/Pumps: AROM;10 reps;15 reps;Supine   Assessment/Plan    PT Assessment Patient needs continued PT  services  PT Problem List Decreased strength;Decreased range of motion;Decreased activity tolerance;Decreased balance;Decreased mobility;Decreased knowledge of use of DME;Pain       PT Treatment Interventions DME instruction;Gait training;Stair training;Functional mobility training;Therapeutic activities;Therapeutic exercise;Patient/family education;Balance training    PT Goals (Current goals can be found in the Care Plan  section)  Acute Rehab PT Goals Patient Stated Goal: Regain IND PT Goal Formulation: With patient Time For Goal Achievement: 10/15/20 Potential to Achieve Goals: Good    Frequency 7X/week   Barriers to discharge        Co-evaluation               AM-PAC PT "6 Clicks" Mobility  Outcome Measure Help needed turning from your back to your side while in a flat bed without using bedrails?: A Little Help needed moving from lying on your back to sitting on the side of a flat bed without using bedrails?: A Little Help needed moving to and from a bed to a chair (including a wheelchair)?: A Little Help needed standing up from a chair using your arms (e.g., wheelchair or bedside chair)?: A Little Help needed to walk in hospital room?: A Little Help needed climbing 3-5 steps with a railing? : A Lot 6 Click Score: 17    End of Session Equipment Utilized During Treatment: Gait belt;Right knee immobilizer Activity Tolerance: Patient tolerated treatment well Patient left: in chair;with call bell/phone within reach;with chair alarm set Nurse Communication: Mobility status PT Visit Diagnosis: Difficulty in walking, not elsewhere classified (R26.2)    Time: 7017-7939 PT Time Calculation (min) (ACUTE ONLY): 32 min   Charges:   PT Evaluation $PT Eval Low Complexity: 1 Low PT Treatments $Gait Training: 8-22 mins        [place] Pager [phone] Office [phone]   Bubber Rothert 10/08/2020, 5:44 PM

## 2020-10-08 NOTE — Care Plan (Signed)
Ortho Bundle Case Management Note  Patient Details  Name: Nancy Lee MRN: [id number] Date of Birth: 11-May-1956   [facility] call to patient to discuss her upcoming Right total knee replacement with Dr. Ninfa Linden on Friday, 10/08/20. Patient is an Ortho bundle through [facility] and is agreeable to case management. She is planning on going home after discharge with her spouse, who will be assisting. She has elevated toilets in the home, but will need a FWW. Referral made to [place] for delivery to hospital prior to discharge. Anticipate HHPT will be needed after short hospital stay. Choice provided and referral made to [facility] ([facility]). F/U post op appointment with Dr. Ninfa Linden on 10/21/20 at 3:15 pm. Her first OPPT is scheduled with [facility] (Dr. Trevor Mace office) on 10/21/20 at 2:30 pm. Reviewed all post op care instructions and these were mailed to her home address as well. Will continue to follow for needs.                  DME Arranged:  Walker rolling (Patient reports having elevated toilets in home) DME Agency:  [employer]  HH Arranged:  PT [place]:  [facility] ([facility])  Additional Comments: Please contact me with any questions of if this plan should need to change.  Jamse Arn, RN, BSN, [employer]  [phone] 10/08/2020, 10:31 AM

## 2020-10-08 NOTE — Progress Notes (Signed)
Assisted Dr. Thomas Brock with Right Knee Adductor Canal block. Side rails up, monitors on throughout procedure. See vital signs in flow sheet. Tolerated Procedure well. ? ?

## 2020-10-09 ENCOUNTER — Other Ambulatory Visit: Payer: Self-pay

## 2020-10-09 ENCOUNTER — Encounter (HOSPITAL_COMMUNITY): Payer: Self-pay | Admitting: Orthopaedic Surgery

## 2020-10-09 DIAGNOSIS — M1711 Unilateral primary osteoarthritis, right knee: Secondary | ICD-10-CM | POA: Diagnosis not present

## 2020-10-09 LAB — CBC
HCT: 32.3 % — ABNORMAL LOW (ref 36.0–46.0)
Hemoglobin: 10.8 g/dL — ABNORMAL LOW (ref 12.0–15.0)
MCH: 30.2 pg (ref 26.0–34.0)
MCHC: 33.4 g/dL (ref 30.0–36.0)
MCV: 90.2 fL (ref 80.0–100.0)
Platelets: 187 10*3/uL (ref 150–400)
RBC: 3.58 MIL/uL — ABNORMAL LOW (ref 3.87–5.11)
RDW: 12.4 % (ref 11.5–15.5)
WBC: 9.4 10*3/uL (ref 4.0–10.5)
nRBC: 0 % (ref 0.0–0.2)

## 2020-10-09 LAB — BASIC METABOLIC PANEL
Anion gap: 10 (ref 5–15)
BUN: 14 mg/dL (ref 8–23)
CO2: 24 mmol/L (ref 22–32)
Calcium: 8.8 mg/dL — ABNORMAL LOW (ref 8.9–10.3)
Chloride: 103 mmol/L (ref 98–111)
Creatinine, Ser: 0.56 mg/dL (ref 0.44–1.00)
GFR, Estimated: >60 mL/min (ref >60)
Glucose, Bld: 120 mg/dL — ABNORMAL HIGH (ref 70–99)
Potassium: 3.6 mmol/L (ref 3.5–5.1)
Sodium: 137 mmol/L (ref 135–145)

## 2020-10-09 MED ORDER — HYDROMORPHONE HCL 2 MG PO TABS: 2.0000 mg | ORAL_TABLET | ORAL | 0 refills | Status: DC | PRN

## 2020-10-09 MED ORDER — METHOCARBAMOL 500 MG PO TABS: 500.0000 mg | ORAL_TABLET | Freq: Four times a day (QID) | ORAL | 1 refills | Status: DC | PRN

## 2020-10-09 MED ORDER — ASPIRIN 81 MG PO CHEW: 81.0000 mg | CHEWABLE_TABLET | Freq: Two times a day (BID) | ORAL | 0 refills | Status: AC

## 2020-10-09 MED ORDER — HYDROMORPHONE HCL 2 MG PO TABS
2.0000 mg | ORAL_TABLET | ORAL | Status: DC | PRN
  Administered 2020-10-09 – 2020-10-10 (×6): 2 mg via ORAL
  Filled 2020-10-09 (×6): qty 1

## 2020-10-09 NOTE — Progress Notes (Signed)
Physical Therapy Treatment Patient Details Name: Nancy Lee MRN: [id number] DOB: 1955/09/19 Today's Date: 10/09/2020    History of Present Illness Pt s/p R TKR    PT Comments    Pt very cooperative and with marginal improvement in reported pain level but willing to attempt ambulation.  Pt up to ambulate increased but still limited distance in hall.  Pt hopeful for dc home tomorrow.  Follow Up Recommendations  Home health PT;Follow surgeon's recommendation for DC plan and follow-up therapies     Equipment Recommendations  Rolling walker with 5" wheels    Recommendations for Other Services       Precautions / Restrictions Precautions Precautions: Fall;Knee Required Braces or Orthoses: Knee Immobilizer - Right Knee Immobilizer - Right: Discontinue once straight leg raise with < 10 degree lag Restrictions Weight Bearing Restrictions: No Other Position/Activity Restrictions: WBAT    Mobility  Bed Mobility Overal bed mobility: Needs Assistance Bed Mobility: Supine to Sit;Sit to Supine     Supine to sit: Min guard Sit to supine: Min assist   General bed mobility comments: cues for sequence and assist for R LE management    Transfers Overall transfer level: Needs assistance Equipment used: Rolling walker (2 wheeled) Transfers: Sit to/from Stand Sit to Stand: Min guard         General transfer comment: cues for LE management and use of UEs to self assist  Ambulation/Gait Ambulation/Gait assistance: Min assist;Min guard Gait Distance (Feet): 65 Feet Assistive device: Rolling walker (2 wheeled) Gait Pattern/deviations: Step-to pattern Gait velocity: decr   General Gait Details: cues for sequence, posture and position from [employer]             Wheelchair Mobility    Modified Rankin (Stroke Patients Only)       Balance Overall balance assessment: Needs assistance Sitting-balance support: No upper extremity supported;Feet supported Sitting  balance-Leahy Scale: Good     Standing balance support: No upper extremity supported Standing balance-Leahy Scale: Fair                              Cognition Arousal/Alertness: Awake/alert Behavior During Therapy: WFL for tasks assessed/performed Overall Cognitive Status: Within Functional Limits for tasks assessed                                        Exercises      General Comments        Pertinent Vitals/Pain Pain Assessment: 0-10 Pain Score: 7  Pain Location: R knee Pain Descriptors / Indicators: Aching;Sore Pain Intervention(s): Limited activity within patient's tolerance;Monitored during session;Premedicated before session;Ice applied    Home Living                      Prior Function            PT Goals (current goals can now be found in the care plan section) Acute Rehab PT Goals Patient Stated Goal: Regain IND PT Goal Formulation: With patient Time For Goal Achievement: 10/15/20 Potential to Achieve Goals: Good Progress towards PT goals: Progressing toward goals    Frequency    7X/week      PT Plan Current plan remains appropriate    Co-evaluation              AM-PAC PT "6 Clicks" Mobility  Outcome Measure  Help needed turning from your back to your side while in a flat bed without using bedrails?: A Little Help needed moving from lying on your back to sitting on the side of a flat bed without using bedrails?: A Little Help needed moving to and from a bed to a chair (including a wheelchair)?: A Little Help needed standing up from a chair using your arms (e.g., wheelchair or bedside chair)?: A Little Help needed to walk in hospital room?: A Little Help needed climbing 3-5 steps with a railing? : A Lot 6 Click Score: 17    End of Session Equipment Utilized During Treatment: Gait belt;Right knee immobilizer Activity Tolerance: Patient tolerated treatment well Patient left: with call bell/phone  within reach;in bed;with bed alarm set Nurse Communication: Mobility status PT Visit Diagnosis: Difficulty in walking, not elsewhere classified (R26.2)     Time: 7048-8891 PT Time Calculation (min) (ACUTE ONLY): 23 min  Charges:  $Gait Training: 23-37 mins                     [place] Pager [phone] Office [phone]    Shawnda Mauney 10/09/2020, 4:54 PM

## 2020-10-09 NOTE — Plan of Care (Signed)
°  Problem: Education: °Goal: Knowledge of General Education information will improve °Description: Including pain rating scale, medication(s)/side effects and non-pharmacologic comfort measures °Outcome: Progressing °  °Problem: Activity: °Goal: Risk for activity intolerance will decrease °Outcome: Progressing °  °Problem: Elimination: °Goal: Will not experience complications related to urinary retention °Outcome: Progressing °  °

## 2020-10-09 NOTE — TOC Progression Note (Signed)
Transition of Care [facility]) - Progression Note    Patient Details  Name: Nancy Lee MRN: [id number] Date of Birth: [birth year]  Transition of [facility]) CM/SW Contact  Joaquin Courts, RN Phone Number: 10/09/2020, 11:42 AM  Clinical Narrative:    [facility] to provide home health PT, [employer] to deliver rolling walker to bedside for home use, patient declined 3in1.    Expected Discharge Plan: [place] Barriers to Discharge: No Barriers Identified  Expected Discharge Plan and Services Expected Discharge Plan: [facility]   Discharge Planning Services: CM Consult Post Acute Care Choice: [place] arrangements for the past 2 months: Single Family Home                 DME Arranged: Walker rolling DME Agency: Other - Comment [facility]) Date DME Agency Contacted: 10/09/20 Time DME Agency Contacted: [id number] Representative spoke with at DME Agency: jerm[facility] [place]: PT [place]: [facility] [employer]     Representative spoke with at [place]: pre-arranged in md office   Social Determinants of Health ([place]) Interventions    Readmission Risk Interventions No flowsheet data found.

## 2020-10-09 NOTE — Discharge Instructions (Signed)

## 2020-10-09 NOTE — Progress Notes (Signed)
Patient ID: Nancy Lee, female   DOB: Nov 18, 1955, 65 y.o.   MRN: [id number] She is awake and alert.  Her vital signs are stable and her labs are stable.  I changed the dressing on her right knee and her incision looks good.  I placed a new Aquacel dressing.  I have spoken with physical therapy.  They felt that the patient would benefit from 1 more day here for mobility purposes and safety purposes with ambulation.  We need to work on better pain control as well.  I spoke to her about this at length in detail.  I will switch her from oral oxycodone to oral Dilaudid.  Our plan will be to discharge her to home tomorrow.  As a note to the utilization review staff, since the patient will be staying tonight, they have my permission as a voice order to switch the patient to an inpatient admission if that is what is required by her insurance carrier.  I will leave that up to their expertise.  I will send in medications in anticipation of the patient being discharged to home tomorrow.

## 2020-10-09 NOTE — Plan of Care (Signed)
Plan of care reviewed and discussed with the patient. 

## 2020-10-09 NOTE — Progress Notes (Signed)
Physical Therapy Treatment Patient Details Name: Nancy Lee MRN: [id number] DOB: 1955/11/29 Today's Date: 10/09/2020    History of Present Illness Pt s/p R TKR    PT Comments    Pt very cooperative and progressing with mobility but limited by increased pain this am.   Follow Up Recommendations  Home health PT;Follow surgeon's recommendation for DC plan and follow-up therapies     Equipment Recommendations  Rolling walker with 5" wheels    Recommendations for Other Services       Precautions / Restrictions Precautions Precautions: Fall;Knee Required Braces or Orthoses: Knee Immobilizer - Right Knee Immobilizer - Right: Discontinue once straight leg raise with < 10 degree lag Restrictions Weight Bearing Restrictions: No Other Position/Activity Restrictions: WBAT    Mobility  Bed Mobility Overal bed mobility: Needs Assistance Bed Mobility: Sit to Supine       Sit to supine: Min assist   General bed mobility comments: cues for sequence and assist for R LE management    Transfers Overall transfer level: Needs assistance Equipment used: Rolling walker (2 wheeled) Transfers: Sit to/from Stand Sit to Stand: Min assist         General transfer comment: cues for LE management and use of UEs to self assist  Ambulation/Gait Ambulation/Gait assistance: Min assist Gait Distance (Feet): 50 Feet Assistive device: Rolling walker (2 wheeled) Gait Pattern/deviations: Step-to pattern Gait velocity: decr   General Gait Details: cues for sequence, posture and position from [employer]             Wheelchair Mobility    Modified Rankin (Stroke Patients Only)       Balance Overall balance assessment: Needs assistance Sitting-balance support: No upper extremity supported;Feet supported Sitting balance-Leahy Scale: Good     Standing balance support: Bilateral upper extremity supported Standing balance-Leahy Scale: Poor                               Cognition Arousal/Alertness: Awake/alert Behavior During Therapy: WFL for tasks assessed/performed Overall Cognitive Status: Within Functional Limits for tasks assessed                                        Exercises Total Joint Exercises Ankle Circles/Pumps: AROM;10 reps;15 reps;Supine Quad Sets: AROM;Both;10 reps;Supine Heel Slides: AAROM;Right;15 reps;Supine Straight Leg Raises: AAROM;Right;10 reps;Supine    General Comments        Pertinent Vitals/Pain Pain Assessment: 0-10 Pain Score: 8  Pain Location: R knee Pain Descriptors / Indicators: Aching;Sore Pain Intervention(s): Limited activity within patient's tolerance;Monitored during session;Premedicated before session;Ice applied    Home Living                      Prior Function            PT Goals (current goals can now be found in the care plan section) Acute Rehab PT Goals Patient Stated Goal: Regain IND PT Goal Formulation: With patient Time For Goal Achievement: 10/15/20 Potential to Achieve Goals: Good Progress towards PT goals: Progressing toward goals    Frequency    7X/week      PT Plan Current plan remains appropriate    Co-evaluation              AM-PAC PT "6 Clicks" Mobility   Outcome Measure  Help needed  turning from your back to your side while in a flat bed without using bedrails?: A Little Help needed moving from lying on your back to sitting on the side of a flat bed without using bedrails?: A Little Help needed moving to and from a bed to a chair (including a wheelchair)?: A Little Help needed standing up from a chair using your arms (e.g., wheelchair or bedside chair)?: A Little Help needed to walk in hospital room?: A Little Help needed climbing 3-5 steps with a railing? : A Lot 6 Click Score: 17    End of Session Equipment Utilized During Treatment: Gait belt;Right knee immobilizer Activity Tolerance: Patient tolerated treatment  well Patient left: with call bell/phone within reach;in bed;with bed alarm set Nurse Communication: Mobility status PT Visit Diagnosis: Difficulty in walking, not elsewhere classified (R26.2)     Time: 0354-6568 PT Time Calculation (min) (ACUTE ONLY): 32 min  Charges:  $Gait Training: 8-22 mins $Therapeutic Exercise: 8-22 mins                     [place] Pager [phone] Office [phone]    Jaisean Monteforte 10/09/2020, 1:17 PM

## 2020-10-10 DIAGNOSIS — M1711 Unilateral primary osteoarthritis, right knee: Secondary | ICD-10-CM | POA: Diagnosis not present

## 2020-10-10 NOTE — Progress Notes (Signed)
Both patient and spouse verbalized understanding of discharge instructions and instructions for the ice buddy machine. Spouse took belongings including icebuddy, DME walker to car.

## 2020-10-10 NOTE — Discharge Summary (Signed)
Patient ID: Nancy Lee MRN: [id number] DOB/AGE: [birth year] 65 y.o.  Admit date: 10/08/2020 Discharge date: 10/10/2020  Admission Diagnoses:  Principal Problem:   Unilateral primary osteoarthritis, right knee Active Problems:   Status post total right knee replacement   Discharge Diagnoses:  Same  Past Medical History:  Diagnosis Date  . Allergy   . Arthritis   . GERD (gastroesophageal reflux disease)   . History of kidney stones   . Hyperlipidemia   . Hypertension   . Hypothyroidism     Surgeries: Procedure(s): RIGHT TOTAL KNEE ARTHROPLASTY on 10/08/2020   Consultants:   Discharged Condition: Improved  Hospital Course: Nancy Lee is an 65 y.o. female who was admitted 10/08/2020 for operative treatment ofUnilateral primary osteoarthritis, right knee. Patient has severe unremitting pain that affects sleep, daily activities, and work/hobbies. After pre-op clearance the patient was taken to the operating room on 10/08/2020 and underwent  Procedure(s): RIGHT TOTAL KNEE ARTHROPLASTY.    Patient was given perioperative antibiotics:  Anti-infectives (From admission, onward)   Start     Dose/Rate Route Frequency Ordered Stop   10/08/20 2200  ceFAZolin (ANCEF) 1 g in sodium chloride 0.9 % 100 mL IVPB       Note to Pharmacy: Tolerated in OR   1 g 200 mL/hr over 30 Minutes Intravenous Every 6 hours 10/08/20 1738 10/09/20 0611   10/08/20 1600  ceFAZolin (ANCEF) IVPB 1 g/50 mL premix  Status:  Discontinued       Note to Pharmacy: Tolerated in OR   1 g 100 mL/hr over 30 Minutes Intravenous Every 6 hours 10/08/20 1349 10/08/20 1736   10/08/20 0950  ceFAZolin (ANCEF) 2-4 GM/100ML-% IVPB       Note to Pharmacy: Jefm Miles   : cabinet override      10/08/20 0950 10/08/20 2159   10/08/20 0730  clindamycin (CLEOCIN) IVPB 900 mg  Status:  Discontinued        900 mg 100 mL/hr over 30 Minutes Intravenous On call to O.R. 10/08/20 0725 10/08/20 1345       Patient was given  sequential compression devices, early ambulation, and chemoprophylaxis to prevent DVT.  Patient benefited maximally from hospital stay and there were no complications.    Recent vital signs:  Patient Vitals for the past 24 hrs:  BP Temp Temp src Pulse Resp SpO2  10/10/20 0539 122/74 98.8 F (37.1 C) Oral 98 16 100 %  10/09/20 2157 (!) 147/81 98.8 F (37.1 C) Oral 97 16 98 %  10/09/20 1402 138/73 98 F (36.7 C) Oral 84 14 100 %     Recent laboratory studies:  Recent Labs    10/09/20 0241  WBC 9.4  HGB 10.8*  HCT 32.3*  PLT 187  NA 137  K 3.6  CL 103  CO2 24  BUN 14  CREATININE 0.56  GLUCOSE 120*  CALCIUM 8.8*     Discharge Medications:   Allergies as of 10/10/2020      Reactions   Other    [name]    Penicillins Rash   Pt. Reports PCN cream but not PCN injections.      Medication List    STOP taking these medications   aspirin 81 MG tablet Replaced by: aspirin 81 MG chewable tablet     TAKE these medications   aspirin 81 MG chewable tablet Chew 1 tablet (81 mg total) by mouth 2 (two) times daily. Replaces: aspirin 81 MG tablet   calcium  carbonate 1500 (600 Ca) MG Tabs tablet Commonly known as: OSCAL Take 600 mg of elemental calcium by mouth daily.   calcium citrate-vitamin D 315-200 MG-UNIT tablet Commonly known as: CITRACAL+D Take 1 tablet by mouth daily.   diclofenac 75 MG EC tablet Commonly known as: VOLTAREN TAKE 1 TABLET BY MOUTH TWICE A DAY WITH FOOD AS NEEDED FOR PAIN/INFLAMMATION AFTER STEROID COMPLETED   EpiPen 2-Pak 0.3 mg/0.3 mL Soaj injection Generic drug: EPINEPHrine Inject 0.3 mg into the muscle as needed for anaphylaxis.   hydrochlorothiazide 12.5 MG capsule Commonly known as: MICROZIDE Take 12.5 mg by mouth daily.   HYDROmorphone 2 MG tablet Commonly known as: DILAUDID Take 1 tablet (2 mg total) by mouth every 4 (four) hours as needed for severe pain.   hydrOXYzine 25 MG tablet Commonly known as:  ATARAX/VISTARIL Take 25 mg by mouth 3 (three) times daily as needed for itching.   levothyroxine 100 MCG tablet Commonly known as: SYNTHROID Take 1 tablet (100 mcg total) by mouth daily before breakfast.   levothyroxine 125 MCG tablet Commonly known as: SYNTHROID Take by mouth daily before breakfast.   methocarbamol 500 MG tablet Commonly known as: ROBAXIN Take 1 tablet (500 mg total) by mouth every 6 (six) hours as needed for muscle spasms.   multivitamin capsule Take 1 capsule by mouth daily.   omeprazole 40 MG capsule Commonly known as: PRILOSEC Take 40 mg by mouth daily.   simvastatin 20 MG tablet Commonly known as: ZOCOR Take 20 mg by mouth daily.   Vitamin D 50 MCG (2000 UT) Caps Take 2,000 Units by mouth daily.   vitamin E 180 MG (400 UNITS) capsule Take 400 Units by mouth daily.            Durable Medical Equipment  (From admission, onward)         Start     Ordered   10/08/20 1350  DME 3 n 1  Once        10/08/20 1349   10/08/20 1350  DME Walker rolling  Once       Question Answer Comment  Walker: With 5 Inch Wheels   Patient needs a walker to treat with the following condition Status post total right knee replacement      10/08/20 1349          Diagnostic Studies: DG Knee Right Port  Result Date: 10/08/2020 CLINICAL DATA:  Status post right knee replacement today. EXAM: PORTABLE RIGHT KNEE - 1-2 VIEW COMPARISON:  Plain films right knee 03/05/2017. FINDINGS: New total arthroplasty is in place. Hardware is normal in appearance. No acute bony abnormality. Gas in the soft tissues and surgical staples noted. IMPRESSION: Status post right knee replacement.  No acute finding. Electronically Signed   By: Inge Rise M.D.   On: 10/08/2020 12:44    Disposition: Discharge disposition: 01-Home or Self Care          Follow-up Information    Mcarthur Rossetti, MD. Go on 10/21/2020.   Specialty: Orthopedic Surgery Why: at 3:15 pm for your  first post-op appointment  Contact information: [place] [address] [phone]        [facility]. Go on 10/21/2020.   Specialty: Rehabilitation Why: at 2:30 pm for your first in office Physical Therapy appointment. Please arrive 15 minutes early to complete any necessary paperwork. Contact information: [place] [address] [place], [place] Follow up.  Specialty: [place] Why: agency will provide home health physical therapy Contact information: [place] [address] [phone]                Signed: [name] 10/10/2020, 10:12 AM

## 2020-10-10 NOTE — Plan of Care (Signed)
Patient discharging, all care plans met  

## 2020-10-10 NOTE — Progress Notes (Signed)
Physical Therapy Treatment Patient Details Name: Nancy Lee MRN: [id number] DOB: 1955/09/19 Today's Date: 10/10/2020    History of Present Illness Pt s/p R TKR    PT Comments    Pt requesting stair training with spouse prior to dc.  Pt ambulated limited distance to hall and negotiated 2 stairs twice with PT demonstrating on first attempt and spouse assisting on second attempt.  Pt eager for dc home.   Follow Up Recommendations  Home health PT;Follow surgeon's recommendation for DC plan and follow-up therapies     Equipment Recommendations  Rolling walker with 5" wheels    Recommendations for Other Services       Precautions / Restrictions Precautions Precautions: Fall;Knee Required Braces or Orthoses: Knee Immobilizer - Right Knee Immobilizer - Right: Discontinue once straight leg raise with < 10 degree lag Restrictions Weight Bearing Restrictions: No Other Position/Activity Restrictions: WBAT    Mobility  Bed Mobility               General bed mobility comments: Pt up in chair and requests back to same    Transfers Overall transfer level: Needs assistance Equipment used: Rolling walker (2 wheeled) Transfers: Sit to/from Stand Sit to Stand: Min guard;Supervision         General transfer comment: Pt self cues for LE management and use of UEs to self assist  Ambulation/Gait Ambulation/Gait assistance: Min guard;Supervision Gait Distance (Feet): 30 Feet Assistive device: Rolling walker (2 wheeled) Gait Pattern/deviations: Step-to pattern Gait velocity: decr   General Gait Details: min cues for sequence, posture and position from RW   Stairs Stairs: Yes Stairs assistance: Min assist Stair Management: No rails;Step to pattern;Backwards;With walker Number of Stairs: 4 General stair comments: 2 steps twice with cues for sequence and foot/RW placement.  Pt's spouse assisting on second attempt   Wheelchair Mobility    Modified Rankin (Stroke  Patients Only)       Balance Overall balance assessment: Needs assistance Sitting-balance support: No upper extremity supported;Feet supported Sitting balance-Leahy Scale: Good     Standing balance support: No upper extremity supported Standing balance-Leahy Scale: Fair                              Cognition Arousal/Alertness: Awake/alert Behavior During Therapy: WFL for tasks assessed/performed Overall Cognitive Status: Within Functional Limits for tasks assessed                                        Exercises      General Comments        Pertinent Vitals/Pain Pain Assessment: 0-10 Pain Score: 4  Pain Location: R knee with flexion Pain Descriptors / Indicators: Aching;Sore Pain Intervention(s): Limited activity within patient's tolerance;Monitored during session;Premedicated before session    Home Living                      Prior Function            PT Goals (current goals can now be found in the care plan section) Acute Rehab PT Goals Patient Stated Goal: Regain IND PT Goal Formulation: With patient Time For Goal Achievement: 10/15/20 Potential to Achieve Goals: Good Progress towards PT goals: Progressing toward goals    Frequency    7X/week      PT Plan Current plan remains appropriate  Co-evaluation              AM-PAC PT "6 Clicks" Mobility   Outcome Measure  Help needed turning from your back to your side while in a flat bed without using bedrails?: A Little Help needed moving from lying on your back to sitting on the side of a flat bed without using bedrails?: A Little Help needed moving to and from a bed to a chair (including a wheelchair)?: A Little Help needed standing up from a chair using your arms (e.g., wheelchair or bedside chair)?: A Little Help needed to walk in hospital room?: A Little Help needed climbing 3-5 steps with a railing? : A Little 6 Click Score: 18    End of Session  Equipment Utilized During Treatment: Gait belt Activity Tolerance: Patient tolerated treatment well Patient left: in chair;with call bell/phone within reach;with chair alarm set Nurse Communication: Mobility status PT Visit Diagnosis: Difficulty in walking, not elsewhere classified (R26.2)     Time: 7915-0569 PT Time Calculation (min) (ACUTE ONLY): 15 min  Charges:  $Gait Training: 8-22 mins $Therapeutic Activity: 8-22 mins                     [place] Pager [phone] Office [phone]    Kapil Petropoulos 10/10/2020, 3:44 PM

## 2020-10-10 NOTE — Progress Notes (Signed)
Physical Therapy Treatment Patient Details Name: Nancy Lee MRN: [id number] DOB: July 31, 1955 Today's Date: 10/10/2020    History of Present Illness Pt s/p R TKR    PT Comments    Pt continues cooperative and reporting improved pain control.  Pt assisted up to bathroom for toileting and hygiene, ambulated increased distance in hall and performed therex program with assist.  Pt hopeful for dc home this pm.   Follow Up Recommendations  Home health PT;Follow surgeon's recommendation for DC plan and follow-up therapies     Equipment Recommendations  Rolling walker with 5" wheels    Recommendations for Other Services       Precautions / Restrictions Precautions Precautions: Fall;Knee Required Braces or Orthoses: Knee Immobilizer - Right Knee Immobilizer - Right: Discontinue once straight leg raise with < 10 degree lag Restrictions Weight Bearing Restrictions: No Other Position/Activity Restrictions: WBAT    Mobility  Bed Mobility Overal bed mobility: Needs Assistance Bed Mobility: Supine to Sit     Supine to sit: Min guard     General bed mobility comments: Increased time with cues for sequence and use of UEs to self assist    Transfers Overall transfer level: Needs assistance Equipment used: Rolling walker (2 wheeled) Transfers: Sit to/from Stand Sit to Stand: Min guard         General transfer comment: cues for LE management and use of UEs to self assist  Ambulation/Gait Ambulation/Gait assistance: Min guard Gait Distance (Feet): 100 Feet (and additional 20' to bathroom) Assistive device: Rolling walker (2 wheeled) Gait Pattern/deviations: Step-to pattern Gait velocity: decr   General Gait Details: min cues for sequence, posture and position from [employer] Mobility    Modified Rankin (Stroke Patients Only)       Balance Overall balance assessment: Needs assistance Sitting-balance support: No upper extremity  supported;Feet supported Sitting balance-Leahy Scale: Good     Standing balance support: No upper extremity supported Standing balance-Leahy Scale: Fair                              Cognition Arousal/Alertness: Awake/alert Behavior During Therapy: WFL for tasks assessed/performed Overall Cognitive Status: Within Functional Limits for tasks assessed                                        Exercises Total Joint Exercises Ankle Circles/Pumps: AROM;15 reps;Supine;Both Quad Sets: AROM;Both;Supine;15 reps Heel Slides: AAROM;Right;15 reps;Supine Straight Leg Raises: AAROM;Right;Supine;15 reps    General Comments        Pertinent Vitals/Pain Pain Assessment: 0-10 Pain Score: 5  Pain Location: R knee with flexion Pain Descriptors / Indicators: Aching;Sore Pain Intervention(s): Limited activity within patient's tolerance;Monitored during session;Premedicated before session    Home Living                      Prior Function            PT Goals (current goals can now be found in the care plan section) Acute Rehab PT Goals Patient Stated Goal: Regain IND PT Goal Formulation: With patient Time For Goal Achievement: 10/15/20 Potential to Achieve Goals: Good Progress towards PT goals: Progressing toward goals    Frequency    7X/week      PT Plan  Current plan remains appropriate    Co-evaluation              AM-PAC PT "6 Clicks" Mobility   Outcome Measure  Help needed turning from your back to your side while in a flat bed without using bedrails?: A Little Help needed moving from lying on your back to sitting on the side of a flat bed without using bedrails?: A Little Help needed moving to and from a bed to a chair (including a wheelchair)?: A Little Help needed standing up from a chair using your arms (e.g., wheelchair or bedside chair)?: A Little Help needed to walk in hospital room?: A Little Help needed climbing 3-5 steps  with a railing? : A Lot 6 Click Score: 17    End of Session Equipment Utilized During Treatment: Gait belt Activity Tolerance: Patient tolerated treatment well Patient left: in chair;with call bell/phone within reach;with chair alarm set Nurse Communication: Mobility status PT Visit Diagnosis: Difficulty in walking, not elsewhere classified (R26.2)     Time: 5284-1324 PT Time Calculation (min) (ACUTE ONLY): 52 min  Charges:  $Gait Training: 8-22 mins $Therapeutic Exercise: 8-22 mins $Therapeutic Activity: 8-22 mins                     [place] Pager [phone] Office [phone]    Nyle Limb 10/10/2020, 1:07 PM

## 2020-10-10 NOTE — Progress Notes (Signed)
Subjective: 2 Days Post-Op Procedure(s) (LRB): RIGHT TOTAL KNEE ARTHROPLASTY (Right) Patient reports pain as moderate.    Objective: Vital signs in last 24 hours: Temp:  [98 F (36.7 C)-98.8 F (37.1 C)] 98.8 F (37.1 C) (04/24 0539) Pulse Rate:  [84-98] 98 (04/24 0539) Resp:  [14-16] 16 (04/24 0539) BP: (122-147)/(73-81) 122/74 (04/24 0539) SpO2:  [98 %-100 %] 100 % (04/24 0539)  Intake/Output from previous day: 04/23 0701 - 04/24 0700 In: 1447 [P.O.:720; I.V.:627; IV Piggyback:100] Out: 1800 [Urine:1800] Intake/Output this shift: Total I/O In: 240 [P.O.:240] Out: -   Recent Labs    10/09/20 0241  HGB 10.8*   Recent Labs    10/09/20 0241  WBC 9.4  RBC 3.58*  HCT 32.3*  PLT 187   Recent Labs    10/09/20 0241  NA 137  K 3.6  CL 103  CO2 24  BUN 14  CREATININE 0.56  GLUCOSE 120*  CALCIUM 8.8*   No results for input(s): LABPT, INR in the last 72 hours.  Neurologically intact Neurovascular intact Sensation intact distally Intact pulses distally Dorsiflexion/Plantar flexion intact Incision: dressing C/D/I No cellulitis present Compartment soft   Assessment/Plan: 2 Days Post-Op Procedure(s) (LRB): RIGHT TOTAL KNEE ARTHROPLASTY (Right) Advance diet Up with therapy Discharge home with home health after PT only if she continues to mobilize and does well with stair training.  Will put in d/c order, but may d/c this if needed WBAT RLE    Anticipated LOS equal to or greater than 2 midnights due to - Age 73 and older with one or more of the following:  - Obesity  - Expected need for hospital services (PT, OT, Nursing) required for safe  discharge  - Anticipated need for postoperative skilled nursing care or inpatient rehab  - Active co-morbidities: None OR   - Unanticipated findings during/Post Surgery: Slow post-op progression: GI, pain control, mobility  - Patient is a high risk of re-admission due to: None    Nancy Lee 10/10/2020, 10:10  AM

## 2020-10-10 NOTE — Progress Notes (Signed)
Physical Therapy Treatment Patient Details Name: Nancy Lee MRN: [id number] DOB: [birth year] Today's Date: 10/10/2020    History of Present Illness Pt s/p R TKR    PT Comments    Pt continues motivated and progressing with mobility.  Pt up to ambulate in hall and negotiated stairs with written instruction provided.  Written HEP provided and reviewed.  Follow Up Recommendations  Home health PT;Follow surgeon's recommendation for DC plan and follow-up therapies     Equipment Recommendations  Rolling walker with 5" wheels    Recommendations for Other Services       Precautions / Restrictions Precautions Precautions: Fall;Knee Restrictions Weight Bearing Restrictions: No Other Position/Activity Restrictions: WBAT    Mobility  Bed Mobility               General bed mobility comments: Pt up in chair and requests back to same    Transfers Overall transfer level: Needs assistance Equipment used: Rolling walker (2 wheeled) Transfers: Sit to/from Stand Sit to Stand: Min guard;Supervision         General transfer comment: cues for LE management and use of UEs to self assist  Ambulation/Gait Ambulation/Gait assistance: Min guard;Supervision Gait Distance (Feet): 52 Feet Assistive device: Rolling walker (2 wheeled) Gait Pattern/deviations: Step-to pattern Gait velocity: decr   General Gait Details: min cues for sequence, posture and position from RW   Stairs Stairs: Yes Stairs assistance: Min assist Stair Management: No rails;Step to pattern;Backwards;With walker Number of Stairs: 4 General stair comments: 2 steps twice with cues for sequence and foot/RW placement   Wheelchair Mobility    Modified Rankin (Stroke Patients Only)       Balance Overall balance assessment: Needs assistance Sitting-balance support: No upper extremity supported;Feet supported Sitting balance-Leahy Scale: Good     Standing balance support: No upper extremity  supported Standing balance-Leahy Scale: Fair                              Cognition Arousal/Alertness: Awake/alert Behavior During Therapy: WFL for tasks assessed/performed Overall Cognitive Status: Within Functional Limits for tasks assessed                                        Exercises      General Comments        Pertinent Vitals/Pain Pain Assessment: 0-10 Pain Score: 4  Pain Location: R knee with flexion Pain Descriptors / Indicators: Aching;Sore Pain Intervention(s): Limited activity within patient's tolerance;Monitored during session;Premedicated before session;Ice applied    Home Living                      Prior Function            PT Goals (current goals can now be found in the care plan section) Acute Rehab PT Goals Patient Stated Goal: Regain IND PT Goal Formulation: With patient Time For Goal Achievement: 10/15/20 Potential to Achieve Goals: Good Progress towards PT goals: Progressing toward goals    Frequency    7X/week      PT Plan Current plan remains appropriate    Co-evaluation              AM-PAC PT "6 Clicks" Mobility   Outcome Measure  Help needed turning from your back to your side while in a flat bed without using bedrails?:  A Little Help needed moving from lying on your back to sitting on the side of a flat bed without using bedrails?: A Little Help needed moving to and from a bed to a chair (including a wheelchair)?: A Little Help needed standing up from a chair using your arms (e.g., wheelchair or bedside chair)?: A Little Help needed to walk in hospital room?: A Little Help needed climbing 3-5 steps with a railing? : A Little 6 Click Score: 18    End of Session Equipment Utilized During Treatment: Gait belt Activity Tolerance: Patient tolerated treatment well Patient left: in chair;with call bell/phone within reach;with chair alarm set Nurse Communication: Mobility status PT  Visit Diagnosis: Difficulty in walking, not elsewhere classified (R26.2)     Time: 8657-8469 PT Time Calculation (min) (ACUTE ONLY): 26 min  Charges:  $Gait Training: 8-22 mins $Therapeutic Activity: 8-22 mins                     Girard Pager [phone] Office [phone]    Kamarah Bilotta 10/10/2020, 3:40 PM

## 2020-10-10 NOTE — Plan of Care (Signed)
  Problem: Education: Goal: Knowledge of General Education information will improve Description: Including pain rating scale, medication(s)/side effects and non-pharmacologic comfort measures Outcome: Progressing   Problem: Activity: Goal: Risk for activity intolerance will decrease Outcome: Progressing   Problem: Pain Managment: Goal: General experience of comfort will improve Outcome: Progressing   

## 2020-10-11 ENCOUNTER — Telehealth: Payer: Self-pay | Admitting: *Deleted

## 2020-10-11 ENCOUNTER — Encounter (HOSPITAL_COMMUNITY): Payer: Self-pay | Admitting: Orthopaedic Surgery

## 2020-10-11 NOTE — Telephone Encounter (Signed)
Ortho bundle D/C call completed. 

## 2020-10-15 ENCOUNTER — Telehealth: Payer: Self-pay | Admitting: *Deleted

## 2020-10-15 NOTE — Telephone Encounter (Signed)
Attempted Ortho bundle 7 day call to patient; no answer and left VM on cell number. Will re-attempt later in the day.

## 2020-10-15 NOTE — Telephone Encounter (Signed)
Ortho bundle 7 day call complete.

## 2020-10-21 ENCOUNTER — Other Ambulatory Visit: Payer: Self-pay

## 2020-10-21 ENCOUNTER — Ambulatory Visit (INDEPENDENT_AMBULATORY_CARE_PROVIDER_SITE_OTHER): Payer: Medicare Other | Admitting: Orthopaedic Surgery

## 2020-10-21 ENCOUNTER — Encounter: Payer: Self-pay | Admitting: Orthopaedic Surgery

## 2020-10-21 ENCOUNTER — Encounter: Payer: Self-pay | Admitting: Physical Therapy

## 2020-10-21 ENCOUNTER — Ambulatory Visit (INDEPENDENT_AMBULATORY_CARE_PROVIDER_SITE_OTHER): Payer: Medicare Other | Admitting: Physical Therapy

## 2020-10-21 DIAGNOSIS — R2689 Other abnormalities of gait and mobility: Secondary | ICD-10-CM

## 2020-10-21 DIAGNOSIS — R600 Localized edema: Secondary | ICD-10-CM

## 2020-10-21 DIAGNOSIS — M25661 Stiffness of right knee, not elsewhere classified: Secondary | ICD-10-CM

## 2020-10-21 DIAGNOSIS — M25561 Pain in right knee: Secondary | ICD-10-CM

## 2020-10-21 DIAGNOSIS — M6281 Muscle weakness (generalized): Secondary | ICD-10-CM

## 2020-10-21 DIAGNOSIS — Z96651 Presence of right artificial knee joint: Secondary | ICD-10-CM

## 2020-10-21 MED ORDER — HYDROMORPHONE HCL 2 MG PO TABS: 2.0000 mg | ORAL_TABLET | Freq: Four times a day (QID) | ORAL | 0 refills | Status: DC | PRN

## 2020-10-21 MED ORDER — METHOCARBAMOL 500 MG PO TABS: 500.0000 mg | ORAL_TABLET | Freq: Four times a day (QID) | ORAL | 1 refills | Status: DC | PRN

## 2020-10-21 NOTE — Therapy (Signed)
[facility] [place], [place], [address] Phone: [phone]   Fax:  [phone]  Physical Therapy Evaluation  Patient Details  Name: Nancy Lee MRN: [id number] Date of Birth: 26-Mar-1956 Referring Provider (PT): Mcarthur Rossetti, MD   Encounter Date: 10/21/2020   PT End of Session - 10/21/20 1501    Visit Number 1    Number of Visits 16    Date for PT Re-Evaluation 12/16/20    PT Start Time 1430    PT Stop Time 1500    PT Time Calculation (min) 30 min    Activity Tolerance Patient tolerated treatment well    Behavior During Therapy [facility] for tasks assessed/performed           Past Medical History:  Diagnosis Date  . Allergy   . Arthritis   . GERD (gastroesophageal reflux disease)   . History of kidney stones   . Hyperlipidemia   . Hypertension   . Hypothyroidism     Past Surgical History:  Procedure Laterality Date  . ABDOMINAL HYSTERECTOMY  2000   fibroids  . BUNIONECTOMY    . CHOLECYSTECTOMY  2007   gallstones  . DILATION AND CURETTAGE OF UTERUS    . DILATION AND CURETTE    . THYROIDECTOMY  05/02/2011   Procedure: THYROIDECTOMY;  Surgeon: Joyice Faster. Cornett, MD;  Location: [place];  Service: General;  Laterality: Bilateral;  . TONSILLECTOMY     as a child  . TONSILLECTOMY AND ADENOIDECTOMY    . TOTAL KNEE ARTHROPLASTY Right 10/08/2020   Procedure: RIGHT TOTAL KNEE ARTHROPLASTY;  Surgeon: Mcarthur Rossetti, MD;  Location: [facility];  Service: Orthopedics;  Laterality: Right;  Needs RNFA  . TOTAL THYROIDECTOMY  05/02/2011    There were no vitals filed for this visit.    Subjective Assessment - 10/21/20 1432    Subjective Pt is a 65 y/o female who presents to [facility] s/p Rt TKA on 10/08/20.  She presents today amb with RW and c/o difficulty with sleeping and is currently sleeping in a recliner.    Limitations Standing;Walking    Patient Stated Goals improve pain and mobility, sleep better    Currently  in Pain? Yes    Pain Score 7    up to 10/10; at best 6/10   Pain Location Knee    Pain Orientation Right    Pain Descriptors / Indicators Aching;Sore    Pain Type Acute pain;Surgical pain    Pain Onset 1 to 4 weeks ago    Pain Frequency Constant    Aggravating Factors  worse at night, bending knee    Pain Relieving Factors repositioning, meds              [facility] PT Assessment - 10/21/20 1435      Assessment   Medical Diagnosis M17.11 (ICD-10-CM) - Unilateral primary osteoarthritis, right knee    Referring Provider (PT) Mcarthur Rossetti, MD    Onset Date/Surgical Date 10/08/20    Hand Dominance Right    Next MD Visit 10/21/20    Prior Therapy 5 HHPT visits      Precautions   Precautions None      Restrictions   Weight Bearing Restrictions No      Balance Screen   Has the patient fallen in the past 6 months No    Has the patient had a decrease in activity level because of a fear of falling?  No    Is the patient  reluctant to leave their home because of a fear of falling?  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    [place]isure [place] to [place], spend time with family/friends, dancing, walking      Cognition   Overall Cognitive Status Within Functional Limits for tasks assessed      Observation/Other Assessments   Observations increased swelling Rt knee with bandage still covering incision    Focus on Therapeutic Outcomes (FOTO)  44 (predicted 66)      ROM / Strength   AROM  / PROM / Strength AROM;PROM;Strength      AROM   AROM Assessment Site Knee    Right/Left Knee Right;Left    Right Knee Extension -4   supine; -20 seated SLR   Right Knee Flexion 58    Left Knee Extension 5    Left Knee Flexion 138      PROM   PROM Assessment Site Knee    Right/Left Knee Right    Right Knee Extension -2    Right Knee Flexion 63      Strength   Overall Strength Comments Rt knee 3-/5      Ambulation/Gait   Ambulation/Gait Yes    Ambulation/Gait Assistance 6: Modified independent (Device/Increase time)    Assistive device Rolling walker    Gait Pattern Step-to pattern;Decreased stance time - right;Decreased step length - left;Decreased hip/knee flexion - right;Antalgic    Ambulation Surface Level    Gait velocity decreased                      Objective measurements completed on examination: See above findings.       [facility] Adult PT Treatment/Exercise - 10/21/20 1435      Exercises   Exercises Other Exercises    Other Exercises  see pt instructions - performed 5-10 reps of each exercise                  PT Education - 10/21/20 1500    Education Details HEP    Person(s) Educated Patient    Methods Explanation;Demonstration;Handout    Comprehension Verbalized understanding;Returned demonstration;Need further instruction            PT Short Term Goals - 10/21/20 1503      PT SHORT TERM GOAL #1   Title Independent with initial HEP    Time 4    Period Weeks    Status New    Target Date 11/18/20      PT SHORT TERM GOAL #2   Title Rt knee AROM improved 0-90 for improved function    Time 4    Period Weeks    Status New    Target Date 11/18/20             PT Long Term Goals - 10/21/20 1503      PT LONG TERM GOAL #1  Title Independent with final HEP    Time 8    Period Weeks    Status New    Target Date 12/16/20      PT LONG TERM GOAL #2   Title Rt knee AROM improved 0-110 for improved function    Time 8     Period Weeks    Status New    Target Date 12/16/20      PT LONG TERM GOAL #3   Title Amb independently without significant deviations for improved function    Time 8    Period Weeks    Status New    Target Date 12/16/20      PT LONG TERM GOAL #4   Title Report pain < 4/10 for improved function    Time 8    Period Weeks    Status New    Target Date 12/16/20      PT LONG TERM GOAL #5   Title FOTO score improved to 66 for improved function    Time 8    Period Weeks    Status New    Target Date 12/16/20                  Plan - 10/21/20 1501    Clinical Impression Statement Pt is a 65 y/o female who presents to [facility] s/p Rt TKA on 10/08/20.  She demonstrates decreased strength, ROM, increased edema and pain with gait abnormalities affecting functional mobility.  Pt will benefit from PT to address deficits listed.    Personal Factors and Comorbidities Comorbidity 1    Comorbidities HTN    Examination-Activity Limitations Bed Mobility;Sleep;Squat;Stairs;Stand;Dressing;Hygiene/Grooming;Transfers;Locomotion Level    Examination-Participation Restrictions Community Activity;Driving    Stability/Clinical Decision Making Evolving/Moderate complexity    Clinical Decision Making Moderate    Rehab Potential Good    PT Frequency 2x / week    PT Duration 8 weeks    PT Treatment/Interventions ADLs/Self Care Home Management;Cryotherapy;Electrical Stimulation;Moist Heat;Balance training;Therapeutic exercise;Therapeutic activities;Functional mobility training;Stair training;Gait training;DME Instruction;Neuromuscular re-education;Patient/family education;Manual techniques;Vasopneumatic Device;Taping;Dry needling;Passive range of motion    PT Next Visit Plan review HEP, aggressive ROM (flexion focus), gait training with cane when able    PT Home Exercise Plan Access Code: [email or web address]    Consulted and Agree with Plan of Care Patient           Patient will benefit from skilled therapeutic  intervention in order to improve the following deficits and impairments:  Abnormal gait,Increased edema,Decreased knowledge of use of DME,Decreased strength,Pain,Increased muscle spasms,Difficulty walking,Decreased mobility,Decreased balance,Decreased range of motion  Visit Diagnosis: Acute pain of right knee - Plan: PT plan of care cert/re-cert  Stiffness of right knee, not elsewhere classified - Plan: PT plan of care cert/re-cert  Other abnormalities of gait and mobility - Plan: PT plan of care cert/re-cert  Localized edema - Plan: PT plan of care cert/re-cert  Muscle weakness (generalized) - Plan: PT plan of care cert/re-cert     Problem List Patient Active Problem List   Diagnosis Date Noted  . Status post total right knee replacement 10/08/2020  . Unilateral primary osteoarthritis, right knee 02/13/2018  . Status post arthroscopy of right knee 02/13/2018  . Old complex tear of lateral meniscus of right knee 08/08/2017  . Chronic pain of right knee 03/05/2017      [name], PT, DPT 10/21/20 3:07 PM    [facility] [address] [place], [place], [address] Phone: [phone]   Fax:  [phone]  Name: RANDEE HUSTON MRN: [id number] Date of Birth: 1956-02-26

## 2020-10-21 NOTE — Progress Notes (Signed)
The patient is 2 weeks tomorrow status post a right total knee arthroplasty.  She is now transition to outpatient therapy and had a visit today.  Her home health therapy note states that she is only to flex to about 70 degrees.  I stressed her the importance of outpatient therapy and getting her knee bending and moving.  Her calf is soft today.  Her right knee incision looks good and staples been removed and Steri-Strips applied.  She lacks full extension by few degrees and I can only flex her to about 70 degrees.  I will refill her pain medicine and muscle relaxant and have encouraged her to try to get that knee bending and moving as much as he can.  We will see her back in 4 weeks for repeat exam after a good course of outpatient therapy.

## 2020-10-21 NOTE — Patient Instructions (Signed)
Access Code: [email or web address] URL: [email or web address] Date: 10/21/2020 Prepared by: Faustino Congress  Exercises Supine Quad Set - 5-6 x daily - 7 x weekly - 2-3 sets - 10 reps - 5 sec hold Supine Heel Slide with Strap - 5-6 x daily - 7 x weekly - 2-3 sets - 10 reps Seated Knee Flexion AAROM - 5-6 x daily - 7 x weekly - 2-3 sets - 10 reps - 10 sec hold Seated Long Arc Quad - 5-6 x daily - 7 x weekly - 2-3 sets - 10 reps - 5 sec hold

## 2020-10-26 ENCOUNTER — Ambulatory Visit (INDEPENDENT_AMBULATORY_CARE_PROVIDER_SITE_OTHER): Payer: Medicare Other | Admitting: Rehabilitative and Restorative Service Providers"

## 2020-10-26 ENCOUNTER — Other Ambulatory Visit: Payer: Self-pay

## 2020-10-26 ENCOUNTER — Encounter: Payer: Self-pay | Admitting: Rehabilitative and Restorative Service Providers"

## 2020-10-26 DIAGNOSIS — M25561 Pain in right knee: Secondary | ICD-10-CM

## 2020-10-26 DIAGNOSIS — R262 Difficulty in walking, not elsewhere classified: Secondary | ICD-10-CM | POA: Diagnosis not present

## 2020-10-26 DIAGNOSIS — M6281 Muscle weakness (generalized): Secondary | ICD-10-CM

## 2020-10-26 DIAGNOSIS — M25661 Stiffness of right knee, not elsewhere classified: Secondary | ICD-10-CM | POA: Diagnosis not present

## 2020-10-26 DIAGNOSIS — R600 Localized edema: Secondary | ICD-10-CM | POA: Diagnosis not present

## 2020-10-26 NOTE — Therapy (Signed)
[facility] -  Physical Therapy [address] [place], [place], [address] Phone: [phone]   Fax:  [phone]  Physical Therapy Treatment  Patient Details  Name: Nancy Lee MRN: [id number] Date of Birth: 1955/12/22 Referring Provider (PT): Mcarthur Rossetti, MD   Encounter Date: 10/26/2020   PT End of Session - 10/26/20 1433    Visit Number 2    Number of Visits 16    Date for PT Re-Evaluation 12/16/20    PT Start Time 1346    PT Stop Time 1441    PT Time Calculation (min) 55 min    Activity Tolerance Patient tolerated treatment well    Behavior During Therapy [facility] for tasks assessed/performed           Past Medical History:  Diagnosis Date  . Allergy   . Arthritis   . GERD (gastroesophageal reflux disease)   . History of kidney stones   . Hyperlipidemia   . Hypertension   . Hypothyroidism     Past Surgical History:  Procedure Laterality Date  . ABDOMINAL HYSTERECTOMY  2000   fibroids  . BUNIONECTOMY    . CHOLECYSTECTOMY  2007   gallstones  . DILATION AND CURETTAGE OF UTERUS    . DILATION AND CURETTE    . THYROIDECTOMY  05/02/2011   Procedure: THYROIDECTOMY;  Surgeon: Joyice Faster. Cornett, MD;  Location: [facility];  Service: General;  Laterality: Bilateral;  . TONSILLECTOMY     as a child  . TONSILLECTOMY AND ADENOIDECTOMY    . TOTAL KNEE ARTHROPLASTY Right 10/08/2020   Procedure: RIGHT TOTAL KNEE ARTHROPLASTY;  Surgeon: Mcarthur Rossetti, MD;  Location: [facility];  Service: Orthopedics;  Laterality: Right;  Needs RNFA  . TOTAL THYROIDECTOMY  05/02/2011    There were no vitals filed for this visit.   Subjective Assessment - 10/26/20 1423    Subjective [name] reports early HEP compliance.  Sleep has been challenging.    Limitations Standing;Walking    How long can you sit comfortably? 30 minutes    How long can you stand comfortably? 10 minutes    How long can you walk comfortably? 10 minutes    Patient Stated Goals improve pain  and mobility, sleep better    Currently in Pain? Yes    Pain Score 6     Pain Location Knee    Pain Orientation Right    Pain Descriptors / Indicators Aching;Sore;Tightness    Pain Type Acute pain;Surgical pain    Pain Onset 1 to 4 weeks ago    Pain Frequency Constant    Aggravating Factors  Prolonged postures, too much WB and end range AROM/PROM    Pain Relieving Factors Change of position and pain medications    Effect of Pain on Daily Activities Needs walker, limited endurance with ADLs, poor sleep    Multiple Pain Sites No                             [facility] Adult PT Treatment/Exercise - 10/26/20 0001      Exercises   Exercises Knee/Hip      Knee/Hip Exercises: Stretches   Other Knee/Hip Stretches Supine knee flexion AAROM with belt 10X 10 seconds    Other Knee/Hip Stretches Tailgate knee flexion 2 minutes      Knee/Hip Exercises: Aerobic   Recumbent Bike Seat 5 for 8 minutes AAROM only      Knee/Hip Exercises: Field seismologist for Strengthening  Total Gym Leg Press 75# 2 sets of 15 lock out R knee at top and stretch into flexion at the bottom      Knee/Hip Exercises: Seated   Other Seated Knee/Hip Exercises Seated knee flexion AAROM L pushes R into flexion 10X 10 seconds      Knee/Hip Exercises: Supine   Quad Sets Strengthening;Both;2 sets;10 reps;Limitations    Quad Sets Limitations 5 seconds      Modalities   Modalities Vasopneumatic      Vasopneumatic   Number Minutes Vasopneumatic  10 minutes    Vasopnuematic Location  Knee    Vasopneumatic Pressure Low    Vasopneumatic Temperature  34                  PT Education - 10/26/20 1427    Person(s) Educated Patient    Methods Explanation;Demonstration;Tactile cues;Verbal cues    Comprehension Verbal cues required;Need further instruction;Returned demonstration;Verbalized understanding;Tactile cues required            PT Short Term Goals - 10/26/20 1427      PT SHORT TERM GOAL #1    Title Independent with initial HEP    Time 4    Period Weeks    Status On-going    Target Date 11/18/20      PT SHORT TERM GOAL #2   Title Rt knee AROM improved 0-90 for improved function    Time 4    Period Weeks    Status On-going    Target Date 11/18/20             PT Long Term Goals - 10/26/20 1432      PT LONG TERM GOAL #1   Title Independent with final HEP    Time 8    Period Weeks    Status On-going      PT LONG TERM GOAL #2   Title Rt knee AROM improved 0-110 for improved function    Time 8    Period Weeks    Status On-going      PT LONG TERM GOAL #3   Title Amb independently without significant deviations for improved function    Time 8    Period Weeks    Status On-going      PT LONG TERM GOAL #4   Title Report pain < 4/10 for improved function    Time 8    Period Weeks    Status On-going      PT LONG TERM GOAL #5   Title FOTO score improved to 66 for improved function    Time 8    Period Weeks    Status On-going                 Plan - 10/26/20 1434    Clinical Impression Statement Nancy Lee reports early HEP compliance.  Extension AROM is near normal (edema limited) while flexion AROM and quadriceps strength will benefit from continued work.  Encouraged her to use a walker as she is slow and uncomfortable with her cane.    Personal Factors and Comorbidities Comorbidity 1    Comorbidities HTN    Examination-Activity Limitations Bed Mobility;Sleep;Squat;Stairs;Stand;Dressing;Hygiene/Grooming;Transfers;Locomotion Level    Examination-Participation Restrictions Community Activity;Driving    Stability/Clinical Decision Making Evolving/Moderate complexity    Rehab Potential Good    PT Frequency 2x / week    PT Duration 8 weeks    PT Treatment/Interventions ADLs/Self Care Home Management;Cryotherapy;Electrical Stimulation;Moist Heat;Balance training;Therapeutic exercise;Therapeutic activities;Functional mobility training;Stair training;Gait  training;DME Instruction;Neuromuscular  re-education;Patient/family education;Manual techniques;Vasopneumatic Device;Taping;Dry needling;Passive range of motion    PT Next Visit Plan review HEP, aggressive ROM (flexion focus), gait training with cane when able    PT Home Exercise Plan Access Code: [email or web address]    Consulted and Agree with Plan of Care Patient           Patient will benefit from skilled therapeutic intervention in order to improve the following deficits and impairments:  Abnormal gait,Increased edema,Decreased knowledge of use of DME,Decreased strength,Pain,Increased muscle spasms,Difficulty walking,Decreased mobility,Decreased balance,Decreased range of motion  Visit Diagnosis: Difficulty walking  Muscle weakness (generalized)  Localized edema  Stiffness of right knee, not elsewhere classified  Acute pain of right knee     Problem List Patient Active Problem List   Diagnosis Date Noted  . Status post total right knee replacement 10/08/2020  . Unilateral primary osteoarthritis, right knee 02/13/2018  . Status post arthroscopy of right knee 02/13/2018  . Old complex tear of lateral meniscus of right knee 08/08/2017  . Chronic pain of right knee 03/05/2017    Farley Ly PT, MPT 10/26/2020, 2:36 PM  [facility] [address] [place], [place], [address] Phone: [phone]   Fax:  [phone]  Name: Nancy Lee MRN: [id number] Date of Birth: [birth year]

## 2020-11-03 ENCOUNTER — Ambulatory Visit (INDEPENDENT_AMBULATORY_CARE_PROVIDER_SITE_OTHER): Payer: Medicare Other | Admitting: Physical Therapy

## 2020-11-03 ENCOUNTER — Encounter: Payer: Self-pay | Admitting: Physical Therapy

## 2020-11-03 ENCOUNTER — Other Ambulatory Visit: Payer: Self-pay

## 2020-11-03 DIAGNOSIS — R262 Difficulty in walking, not elsewhere classified: Secondary | ICD-10-CM

## 2020-11-03 DIAGNOSIS — R600 Localized edema: Secondary | ICD-10-CM

## 2020-11-03 DIAGNOSIS — M6281 Muscle weakness (generalized): Secondary | ICD-10-CM

## 2020-11-03 DIAGNOSIS — M25561 Pain in right knee: Secondary | ICD-10-CM

## 2020-11-03 DIAGNOSIS — M25661 Stiffness of right knee, not elsewhere classified: Secondary | ICD-10-CM

## 2020-11-03 DIAGNOSIS — R2689 Other abnormalities of gait and mobility: Secondary | ICD-10-CM

## 2020-11-03 NOTE — Therapy (Signed)
[facility] [address] [place], [place], [address] Phone: [phone]   Fax:  [phone]  Physical Therapy Treatment  Patient Details  Name: Nancy Lee MRN: [id number] Date of Birth: 1955/09/12 Referring Provider (PT): Mcarthur Rossetti, MD   Encounter Date: 11/03/2020   PT End of Session - 11/03/20 1229    Visit Number 3    Number of Visits 16    Date for PT Re-Evaluation 12/16/20    PT Start Time 4034    PT Stop Time 1215    PT Time Calculation (min) 40 min    Activity Tolerance Patient tolerated treatment well    Behavior During Therapy [facility] for tasks assessed/performed           Past Medical History:  Diagnosis Date  . Allergy   . Arthritis   . GERD (gastroesophageal reflux disease)   . History of kidney stones   . Hyperlipidemia   . Hypertension   . Hypothyroidism     Past Surgical History:  Procedure Laterality Date  . ABDOMINAL HYSTERECTOMY  2000   fibroids  . BUNIONECTOMY    . CHOLECYSTECTOMY  2007   gallstones  . DILATION AND CURETTAGE OF UTERUS    . DILATION AND CURETTE    . THYROIDECTOMY  05/02/2011   Procedure: THYROIDECTOMY;  Surgeon: Joyice Faster. Cornett, MD;  Location: [place];  Service: General;  Laterality: Bilateral;  . TONSILLECTOMY     as a child  . TONSILLECTOMY AND ADENOIDECTOMY    . TOTAL KNEE ARTHROPLASTY Right 10/08/2020   Procedure: RIGHT TOTAL KNEE ARTHROPLASTY;  Surgeon: Mcarthur Rossetti, MD;  Location: [facility];  Service: Orthopedics;  Laterality: Right;  Needs RNFA  . TOTAL THYROIDECTOMY  05/02/2011    There were no vitals filed for this visit.   Subjective Assessment - 11/03/20 1131    Subjective had some pain and discomfort last night; still having trouble sleeping    Limitations Standing;Walking    How long can you sit comfortably? 30 minutes    How long can you stand comfortably? 10 minutes    How long can you walk comfortably? 10 minutes    Patient Stated Goals improve  pain and mobility, sleep better    Currently in Pain? Yes    Pain Score 7     Pain Location Knee    Pain Orientation Right    Pain Descriptors / Indicators Aching;Sore;Tightness    Pain Type Acute pain;Surgical pain    Pain Onset 1 to 4 weeks ago    Pain Frequency Constant    Aggravating Factors  prolonged postures, too much WB and end ranges    Pain Relieving Factors change of positions and meds              [facility] PT Assessment - 11/03/20 1156      Assessment   Medical Diagnosis M17.11 (ICD-10-CM) - Unilateral primary osteoarthritis, right knee    Referring Provider (PT) Mcarthur Rossetti, MD    Onset Date/Surgical Date 10/08/20    Next MD Visit 11/18/20      AROM   Right Knee Flexion 82      PROM   Right Knee Flexion 93                         [facility] Adult PT Treatment/Exercise - 11/03/20 1136      Knee/Hip Exercises: Stretches   Knee: Self-Stretch to increase Flexion Right;10 seconds  10 reps   Knee: Self-Stretch Limitations with LLE overpressure    Other Knee/Hip Stretches Tailgate knee flexion 2 minutes      Knee/Hip Exercises: Aerobic   Recumbent Bike partial revolutions x 8 min; seat 5      Knee/Hip Exercises: Seated   Long Arc Quad Right;15 reps    Long [employer] Limitations with cues for isolated quad activation      Knee/Hip Exercises: Supine   Short Arc [employer];Right;15 reps    Short Arc Quad Sets Limitations in long sitting for visual cues for quad activation    Other Supine Knee/Hip Exercises heelslides with foot on red pball with strap 20 reps      Manual Therapy   Manual therapy comments seated flexion with LLE LAQ 3x10; Rt knee flexion with contract relax                    PT Short Term Goals - 10/26/20 1427      PT SHORT TERM GOAL #1   Title Independent with initial HEP    Time 4    Period Weeks    Status On-going    Target Date 11/18/20      PT SHORT TERM GOAL #2   Title Rt knee AROM improved 0-90  for improved function    Time 4    Period Weeks    Status On-going    Target Date 11/18/20             PT Long Term Goals - 10/26/20 1432      PT LONG TERM GOAL #1   Title Independent with final HEP    Time 8    Period Weeks    Status On-going      PT LONG TERM GOAL #2   Title Rt knee AROM improved 0-110 for improved function    Time 8    Period Weeks    Status On-going      PT LONG TERM GOAL #3   Title Amb independently without significant deviations for improved function    Time 8    Period Weeks    Status On-going      PT LONG TERM GOAL #4   Title Report pain < 4/10 for improved function    Time 8    Period Weeks    Status On-going      PT LONG TERM GOAL #5   Title FOTO score improved to 66 for improved function    Time 8    Period Weeks    Status On-going                 Plan - 11/03/20 1229    Clinical Impression Statement Pt continues to work hard with current HEP and noted significant improvement in knee flexion ROM today with continued work still needed.  She still has difficulty isolating quad contraction but visual cues helpful and improved with repetition.  Will continue to benefit from PT to maximize function.    Personal Factors and Comorbidities Comorbidity 1    Comorbidities HTN    Examination-Activity Limitations Bed Mobility;Sleep;Squat;Stairs;Stand;Dressing;Hygiene/Grooming;Transfers;Locomotion Level    Examination-Participation Restrictions Community Activity;Driving    Stability/Clinical Decision Making Evolving/Moderate complexity    Rehab Potential Good    PT Frequency 2x / week    PT Duration 8 weeks    PT Treatment/Interventions ADLs/Self Care Home Management;Cryotherapy;Electrical Stimulation;Moist Heat;Balance training;Therapeutic exercise;Therapeutic activities;Functional mobility training;Stair training;Gait training;DME Instruction;Neuromuscular re-education;Patient/family education;Manual techniques;Vasopneumatic  Device;Taping;Dry needling;Passive  range of motion    PT Next Visit Plan aggressive ROM (flexion focus), gait training with cane when able    PT Home Exercise Plan Access Code: [email or web address]    Consulted and Agree with Plan of Care Patient           Patient will benefit from skilled therapeutic intervention in order to improve the following deficits and impairments:  Abnormal gait,Increased edema,Decreased knowledge of use of DME,Decreased strength,Pain,Increased muscle spasms,Difficulty walking,Decreased mobility,Decreased balance,Decreased range of motion  Visit Diagnosis: Difficulty walking  Muscle weakness (generalized)  Localized edema  Stiffness of right knee, not elsewhere classified  Acute pain of right knee  Other abnormalities of gait and mobility     Problem List Patient Active Problem List   Diagnosis Date Noted  . Status post total right knee replacement 10/08/2020  . Unilateral primary osteoarthritis, right knee 02/13/2018  . Status post arthroscopy of right knee 02/13/2018  . Old complex tear of lateral meniscus of right knee 08/08/2017  . Chronic pain of right knee 03/05/2017      [name], PT, DPT 11/03/20 12:34 PM    [facility] [address] [place], [place], [address] Phone: [phone]   Fax:  [phone]  Name: KIMYATA MILICH MRN: [id number] Date of Birth: 1955/08/12

## 2020-11-05 ENCOUNTER — Encounter: Payer: Self-pay | Admitting: Physical Therapy

## 2020-11-05 ENCOUNTER — Ambulatory Visit (INDEPENDENT_AMBULATORY_CARE_PROVIDER_SITE_OTHER): Payer: Medicare Other | Admitting: Physical Therapy

## 2020-11-05 ENCOUNTER — Other Ambulatory Visit: Payer: Self-pay

## 2020-11-05 DIAGNOSIS — M25661 Stiffness of right knee, not elsewhere classified: Secondary | ICD-10-CM

## 2020-11-05 DIAGNOSIS — R600 Localized edema: Secondary | ICD-10-CM

## 2020-11-05 DIAGNOSIS — M25561 Pain in right knee: Secondary | ICD-10-CM

## 2020-11-05 DIAGNOSIS — M6281 Muscle weakness (generalized): Secondary | ICD-10-CM | POA: Diagnosis not present

## 2020-11-05 DIAGNOSIS — R262 Difficulty in walking, not elsewhere classified: Secondary | ICD-10-CM

## 2020-11-05 DIAGNOSIS — R2689 Other abnormalities of gait and mobility: Secondary | ICD-10-CM

## 2020-11-05 NOTE — Therapy (Signed)
[place], [place], [address] Phone: [phone]   Fax:  [phone]  Physical Therapy Treatment  Patient Details  Name: Nancy Lee MRN: [id number] Date of Birth: 1956-02-27 Referring Provider (PT): Mcarthur Rossetti, MD   Encounter Date: 11/05/2020   PT End of Session - 11/05/20 1052    Visit Number 4    Number of Visits 16    Date for PT Re-Evaluation 12/16/20    PT Start Time 1010    PT Stop Time 1052    PT Time Calculation (min) 42 min    Activity Tolerance Patient tolerated treatment well    Behavior During Therapy [facility] for tasks assessed/performed           Past Medical History:  Diagnosis Date  . Allergy   . Arthritis   . GERD (gastroesophageal reflux disease)   . History of kidney stones   . Hyperlipidemia   . Hypertension   . Hypothyroidism     Past Surgical History:  Procedure Laterality Date  . ABDOMINAL HYSTERECTOMY  2000   fibroids  . BUNIONECTOMY    . CHOLECYSTECTOMY  2007   gallstones  . DILATION AND CURETTAGE OF UTERUS    . DILATION AND CURETTE    . THYROIDECTOMY  05/02/2011   Procedure: THYROIDECTOMY;  Surgeon: Joyice Faster. Cornett, MD;  Location: [facility];  Service: General;  Laterality: Bilateral;  . TONSILLECTOMY     as a child  . TONSILLECTOMY AND ADENOIDECTOMY    . TOTAL KNEE ARTHROPLASTY Right 10/08/2020   Procedure: RIGHT TOTAL KNEE ARTHROPLASTY;  Surgeon: Mcarthur Rossetti, MD;  Location: [facility];  Service: Orthopedics;  Laterality: Right;  Needs RNFA  . TOTAL THYROIDECTOMY  05/02/2011    There were no vitals filed for this visit.   Subjective Assessment - 11/05/20 1014    Subjective knee is "tight" got an exercise ball yesterday to help with flexion    Limitations Standing;Walking    How long can you sit comfortably? 30 minutes    How long can you stand comfortably? 10 minutes    How long can you walk comfortably? 10 minutes    Patient Stated Goals improve  pain and mobility, sleep better    Currently in Pain? Yes    Pain Score 7     Pain Location Knee    Pain Orientation Right    Pain Descriptors / Indicators Aching;Sore;Tightness    Pain Type Acute pain;Surgical pain    Pain Onset 1 to 4 weeks ago    Pain Frequency Constant    Aggravating Factors  prolonged postures, too much WB and end ranges    Pain Relieving Factors changing positions, meds                             [facility] Adult PT Treatment/Exercise - 11/05/20 1011      Knee/Hip Exercises: Stretches   Knee: Self-Stretch to increase Flexion Right;10 seconds   10 reps   Knee: Self-Stretch Limitations with LLE overpressure    Other Knee/Hip Stretches Tailgate knee flexion 2 minutes      Knee/Hip Exercises: Aerobic   Nustep L6 seat 7 x 10 min working on flexion      Knee/Hip Exercises: Seated   Long Arc Quad Right;3 sets;10 reps    [employer] Limitations with cues for isolated quad activation      Manual Therapy   Manual therapy  comments seated flexion with LLE LAQ 3x10                    PT Short Term Goals - 10/26/20 1427      PT SHORT TERM GOAL #1   Title Independent with initial HEP    Time 4    Period Weeks    Status On-going    Target Date 11/18/20      PT SHORT TERM GOAL #2   Title Rt knee AROM improved 0-90 for improved function    Time 4    Period Weeks    Status On-going    Target Date 11/18/20             PT Long Term Goals - 10/26/20 1432      PT LONG TERM GOAL #1   Title Independent with final HEP    Time 8    Period Weeks    Status On-going      PT LONG TERM GOAL #2   Title Rt knee AROM improved 0-110 for improved function    Time 8    Period Weeks    Status On-going      PT LONG TERM GOAL #3   Title Amb independently without significant deviations for improved function    Time 8    Period Weeks    Status On-going      PT LONG TERM GOAL #4   Title Report pain < 4/10 for improved function    Time 8     Period Weeks    Status On-going      PT LONG TERM GOAL #5   Title FOTO score improved to 66 for improved function    Time 8    Period Weeks    Status On-going                 Plan - 11/05/20 1052    Clinical Impression Statement Pt tolerated session well with continued focus on improving knee flexion and quad activation.  Needs min cues for technique.  Will continue to benefit from PT to maximize function.    Personal Factors and Comorbidities Comorbidity 1    Comorbidities HTN    Examination-Activity Limitations Bed Mobility;Sleep;Squat;Stairs;Stand;Dressing;Hygiene/Grooming;Transfers;Locomotion Level    Examination-Participation Restrictions Community Activity;Driving    Stability/Clinical Decision Making Evolving/Moderate complexity    Rehab Potential Good    PT Frequency 2x / week    PT Duration 8 weeks    PT Treatment/Interventions ADLs/Self Care Home Management;Cryotherapy;Electrical Stimulation;Moist Heat;Balance training;Therapeutic exercise;Therapeutic activities;Functional mobility training;Stair training;Gait training;DME Instruction;Neuromuscular re-education;Patient/family education;Manual techniques;Vasopneumatic Device;Taping;Dry needling;Passive range of motion    PT Next Visit Plan aggressive ROM (flexion focus), gait training with cane when able; quad activation    PT Home Exercise Plan Access Code: [email or web address]    Consulted and Agree with Plan of Care Patient           Patient will benefit from skilled therapeutic intervention in order to improve the following deficits and impairments:  Abnormal gait,Increased edema,Decreased knowledge of use of DME,Decreased strength,Pain,Increased muscle spasms,Difficulty walking,Decreased mobility,Decreased balance,Decreased range of motion  Visit Diagnosis: Difficulty walking  Muscle weakness (generalized)  Localized edema  Stiffness of right knee, not elsewhere classified  Acute pain of right knee  Other  abnormalities of gait and mobility     Problem List Patient Active Problem List   Diagnosis Date Noted  . Status post total right knee replacement 10/08/2020  . Unilateral primary osteoarthritis, right knee 02/13/2018  .  Status post arthroscopy of right knee 02/13/2018  . Old complex tear of lateral meniscus of right knee 08/08/2017  . Chronic pain of right knee 03/05/2017     [name], PT, DPT 11/05/20 10:54 AM    [facility] [address] [place], [place], [address] Phone: [phone]   Fax:  [phone]  Name: Nancy Lee MRN: [id number] Date of Birth: 06-17-1956

## 2020-11-08 ENCOUNTER — Encounter: Payer: Self-pay | Admitting: Physical Therapy

## 2020-11-08 ENCOUNTER — Ambulatory Visit (INDEPENDENT_AMBULATORY_CARE_PROVIDER_SITE_OTHER): Payer: Medicare Other | Admitting: Physical Therapy

## 2020-11-08 ENCOUNTER — Other Ambulatory Visit: Payer: Self-pay

## 2020-11-08 DIAGNOSIS — R600 Localized edema: Secondary | ICD-10-CM | POA: Diagnosis not present

## 2020-11-08 DIAGNOSIS — R2689 Other abnormalities of gait and mobility: Secondary | ICD-10-CM

## 2020-11-08 DIAGNOSIS — R262 Difficulty in walking, not elsewhere classified: Secondary | ICD-10-CM

## 2020-11-08 DIAGNOSIS — M6281 Muscle weakness (generalized): Secondary | ICD-10-CM | POA: Diagnosis not present

## 2020-11-08 DIAGNOSIS — M25561 Pain in right knee: Secondary | ICD-10-CM

## 2020-11-08 DIAGNOSIS — M25661 Stiffness of right knee, not elsewhere classified: Secondary | ICD-10-CM | POA: Diagnosis not present

## 2020-11-08 NOTE — Therapy (Signed)
[place], [place], [address] Phone: [phone]   Fax:  [phone]  Physical Therapy Treatment  Patient Details  Name: Nancy Lee MRN: [id number] Date of Birth: [birth year] Referring Provider (PT): Mcarthur Rossetti, MD   Encounter Date: 11/08/2020   PT End of Session - 11/08/20 1230    Visit Number 5    Number of Visits 16    Date for PT Re-Evaluation 12/16/20    PT Start Time 6759    PT Stop Time 1232    PT Time Calculation (min) 51 min    Activity Tolerance Patient tolerated treatment well    Behavior During Therapy [facility] for tasks assessed/performed           Past Medical History:  Diagnosis Date  . Allergy   . Arthritis   . GERD (gastroesophageal reflux disease)   . History of kidney stones   . Hyperlipidemia   . Hypertension   . Hypothyroidism     Past Surgical History:  Procedure Laterality Date  . ABDOMINAL HYSTERECTOMY  2000   fibroids  . BUNIONECTOMY    . CHOLECYSTECTOMY  2007   gallstones  . DILATION AND CURETTAGE OF UTERUS    . DILATION AND CURETTE    . THYROIDECTOMY  05/02/2011   Procedure: THYROIDECTOMY;  Surgeon: Joyice Faster. Cornett, MD;  Location: [facility];  Service: General;  Laterality: Bilateral;  . TONSILLECTOMY     as a child  . TONSILLECTOMY AND ADENOIDECTOMY    . TOTAL KNEE ARTHROPLASTY Right 10/08/2020   Procedure: RIGHT TOTAL KNEE ARTHROPLASTY;  Surgeon: Mcarthur Rossetti, MD;  [facility];  Service: Orthopedics;  Laterality: Right;  Needs RNFA  . TOTAL THYROIDECTOMY  05/02/2011    There were no vitals filed for this visit.   Subjective Assessment - 11/08/20 1140    Subjective doing well today, no new c/o    Limitations Standing;Walking    How long can you sit comfortably? 30 minutes    How long can you stand comfortably? 10 minutes    How long can you walk comfortably? 10 minutes    Patient Stated Goals improve pain and mobility, sleep better     Currently in Pain? Yes    Pain Score 6     Pain Location Knee    Pain Orientation Right    Pain Descriptors / Indicators Aching;Sore;Tightness    Pain Type Acute pain;Surgical pain    Pain Onset 1 to 4 weeks ago    Pain Frequency Constant    Aggravating Factors  prolonged static positioning, too much WB and end ranges    Pain Relieving Factors changing positions, meds                             [facility] Adult PT Treatment/Exercise - 11/08/20 1148      Knee/Hip Exercises: Stretches   Knee: Self-Stretch to increase Flexion Right;10 seconds   10 reps   Knee: Self-Stretch Limitations with LLE overpressure    Other Knee/Hip Stretches Tailgate knee flexion 2 minutes      Knee/Hip Exercises: Aerobic   Nustep L6 seat 7 x 10 min working on flexion      Knee/Hip Exercises: Machines for Strengthening   Total Gym Leg Press 87# 3x10 with 2 min hold in flexion between sets      Modalities   Modalities Vasopneumatic      Vasopneumatic   Number  Minutes Vasopneumatic  10 minutes    Vasopnuematic Location  Knee    Vasopneumatic Pressure Medium    Vasopneumatic Temperature  34      Manual Therapy   Manual therapy comments seated flexion, contract relax                    PT Short Term Goals - 10/26/20 1427      PT SHORT TERM GOAL #1   Title Independent with initial HEP    Time 4    Period Weeks    Status On-going    Target Date 11/18/20      PT SHORT TERM GOAL #2   Title Rt knee AROM improved 0-90 for improved function    Time 4    Period Weeks    Status On-going    Target Date 11/18/20             PT Long Term Goals - 10/26/20 1432      PT LONG TERM GOAL #1   Title Independent with final HEP    Time 8    Period Weeks    Status On-going      PT LONG TERM GOAL #2   Title Rt knee AROM improved 0-110 for improved function    Time 8    Period Weeks    Status On-going      PT LONG TERM GOAL #3   Title Amb independently without significant  deviations for improved function    Time 8    Period Weeks    Status On-going      PT LONG TERM GOAL #4   Title Report pain < 4/10 for improved function    Time 8    Period Weeks    Status On-going      PT LONG TERM GOAL #5   Title FOTO score improved to 66 for improved function    Time 8    Period Weeks    Status On-going                 Plan - 11/08/20 1230    Clinical Impression Statement Pt tolerated session well today showing steady improvement in flexion at this time.  Will continue to benefit from PT to maximize function.    Personal Factors and Comorbidities Comorbidity 1    Comorbidities HTN    Examination-Activity Limitations Bed Mobility;Sleep;Squat;Stairs;Stand;Dressing;Hygiene/Grooming;Transfers;Locomotion Level    Examination-Participation Restrictions Community Activity;Driving    Stability/Clinical Decision Making Evolving/Moderate complexity    Rehab Potential Good    PT Frequency 2x / week    PT Duration 8 weeks    PT Treatment/Interventions ADLs/Self Care Home Management;Cryotherapy;Electrical Stimulation;Moist Heat;Balance training;Therapeutic exercise;Therapeutic activities;Functional mobility training;Stair training;Gait training;DME Instruction;Neuromuscular re-education;Patient/family education;Manual techniques;Vasopneumatic Device;Taping;Dry needling;Passive range of motion    PT Next Visit Plan aggressive ROM (flexion focus), gait training with cane when able; quad activation    PT Home Exercise Plan Access Code: [email or web address]    Consulted and Agree with Plan of Care Patient           Patient will benefit from skilled therapeutic intervention in order to improve the following deficits and impairments:  Abnormal gait,Increased edema,Decreased knowledge of use of DME,Decreased strength,Pain,Increased muscle spasms,Difficulty walking,Decreased mobility,Decreased balance,Decreased range of motion  Visit Diagnosis: Difficulty walking  Muscle weakness  (generalized)  Localized edema  Stiffness of right knee, not elsewhere classified  Acute pain of right knee  Other abnormalities of gait and mobility     Problem List  Patient Active Problem List   Diagnosis Date Noted  . Status post total right knee replacement 10/08/2020  . Unilateral primary osteoarthritis, right knee 02/13/2018  . Status post arthroscopy of right knee 02/13/2018  . Old complex tear of lateral meniscus of right knee 08/08/2017  . Chronic pain of right knee 03/05/2017      [name], PT, DPT 11/08/20 12:32 PM    [facility] [address] [place], [place], [address] Phone: [phone]   Fax:  [phone]  Name: DARSI TIEN MRN: [id number] Date of Birth: June 16, 1956

## 2020-11-10 ENCOUNTER — Encounter: Payer: Self-pay | Admitting: Physical Therapy

## 2020-11-10 ENCOUNTER — Other Ambulatory Visit: Payer: Self-pay

## 2020-11-10 ENCOUNTER — Ambulatory Visit (INDEPENDENT_AMBULATORY_CARE_PROVIDER_SITE_OTHER): Payer: Medicare Other | Admitting: Physical Therapy

## 2020-11-10 DIAGNOSIS — R262 Difficulty in walking, not elsewhere classified: Secondary | ICD-10-CM

## 2020-11-10 DIAGNOSIS — M6281 Muscle weakness (generalized): Secondary | ICD-10-CM | POA: Diagnosis not present

## 2020-11-10 DIAGNOSIS — M25661 Stiffness of right knee, not elsewhere classified: Secondary | ICD-10-CM

## 2020-11-10 DIAGNOSIS — R600 Localized edema: Secondary | ICD-10-CM

## 2020-11-10 DIAGNOSIS — R2689 Other abnormalities of gait and mobility: Secondary | ICD-10-CM

## 2020-11-10 DIAGNOSIS — M25561 Pain in right knee: Secondary | ICD-10-CM

## 2020-11-10 NOTE — Therapy (Signed)
[facility] [place], [place], [address] Phone: [phone]   Fax:  [phone]  Physical Therapy Treatment  Patient Details  Name: Nancy Lee MRN: [id number] Date of Birth: Sep 29, 1955 Referring Provider (PT): Mcarthur Rossetti, MD   Encounter Date: 11/10/2020   PT End of Session - 11/10/20 1215    Visit Number 6    Number of Visits 16    Date for PT Re-Evaluation 12/16/20    PT Start Time 6195    PT Stop Time 1205    PT Time Calculation (min) 44 min    Activity Tolerance Patient tolerated treatment well    Behavior During Therapy [facility] for tasks assessed/performed           Past Medical History:  Diagnosis Date  . Allergy   . Arthritis   . GERD (gastroesophageal reflux disease)   . History of kidney stones   . Hyperlipidemia   . Hypertension   . Hypothyroidism     Past Surgical History:  Procedure Laterality Date  . ABDOMINAL HYSTERECTOMY  2000   fibroids  . BUNIONECTOMY    . CHOLECYSTECTOMY  2007   gallstones  . DILATION AND CURETTAGE OF UTERUS    . DILATION AND CURETTE    . THYROIDECTOMY  05/02/2011   Procedure: THYROIDECTOMY;  Surgeon: Joyice Faster. Cornett, MD;  Location: [place];  Service: General;  Laterality: Bilateral;  . TONSILLECTOMY     as a child  . TONSILLECTOMY AND ADENOIDECTOMY    . TOTAL KNEE ARTHROPLASTY Right 10/08/2020   Procedure: RIGHT TOTAL KNEE ARTHROPLASTY;  Surgeon: Mcarthur Rossetti, MD;  Location: [facility];  Service: Orthopedics;  Laterality: Right;  Needs RNFA  . TOTAL THYROIDECTOMY  05/02/2011    There were no vitals filed for this visit.   Subjective Assessment - 11/10/20 1122    Subjective knee is doing well, reports "a little bit" of pain    Limitations Standing;Walking    How long can you sit comfortably? 30 minutes    How long can you stand comfortably? 10 minutes    How long can you walk comfortably? 10 minutes    Patient Stated Goals improve pain and mobility,  sleep better    Currently in Pain? Yes    Pain Score 5     Pain Location Knee    Pain Orientation Right    Pain Descriptors / Indicators Aching;Sore;Tightness    Pain Onset 1 to 4 weeks ago    Pain Frequency Constant    Aggravating Factors  static positioning, weight bearing and end ranges    Pain Relieving Factors changing positions, meds              [facility] PT Assessment - 11/10/20 0001      Assessment   Medical Diagnosis M17.11 (ICD-10-CM) - Unilateral primary osteoarthritis, right knee    Referring Provider (PT) Mcarthur Rossetti, MD    Onset Date/Surgical Date 10/08/20      PROM   Right Knee Extension -2    Right Knee Flexion 100   in sitting                        [facility] Adult PT Treatment/Exercise - 11/10/20 1124      Ambulation/Gait   Ambulation/Gait Yes    Ambulation/Gait Assistance 5: Supervision    Ambulation/Gait Assistance Details cues for sequencing and technique with SPC (with quad tip)    Ambulation Distance (Feet)  100 Feet    Assistive device Straight cane    Gait Comments some quad instability noted with amb with SPC without LOB but recommend household amb only with supervision      Knee/Hip Exercises: Stretches   Knee: Self-Stretch to increase Flexion Right;10 seconds   10 reps   Knee: Self-Stretch Limitations with LLE overpressure    Other Knee/Hip Stretches Tailgate knee flexion 2 minutes      Knee/Hip Exercises: Aerobic   Recumbent Bike partial revolutions x 8 min; seat 6      Knee/Hip Exercises: Seated   Long Arc Quad Right;3 sets;10 reps    Long [employer] Limitations with cues for isolated quad activation      Manual Therapy   Manual therapy comments seated flexion, contract relax                    PT Short Term Goals - 10/26/20 1427      PT SHORT TERM GOAL #1   Title Independent with initial HEP    Time 4    Period Weeks    Status On-going    Target Date 11/18/20      PT SHORT TERM GOAL #2   Title  Rt knee AROM improved 0-90 for improved function    Time 4    Period Weeks    Status On-going    Target Date 11/18/20             PT Long Term Goals - 10/26/20 1432      PT LONG TERM GOAL #1   Title Independent with final HEP    Time 8    Period Weeks    Status On-going      PT LONG TERM GOAL #2   Title Rt knee AROM improved 0-110 for improved function    Time 8    Period Weeks    Status On-going      PT LONG TERM GOAL #3   Title Amb independently without significant deviations for improved function    Time 8    Period Weeks    Status On-going      PT LONG TERM GOAL #4   Title Report pain < 4/10 for improved function    Time 8    Period Weeks    Status On-going      PT LONG TERM GOAL #5   Title FOTO score improved to 66 for improved function    Time 8    Period Weeks    Status On-going                 Plan - 11/10/20 1215    Clinical Impression Statement Pt making steady progress with PROM today and initiated gait with SPC.  Feel she can practice at home with husband's supervision, but still with decreased quad activation so not ready to fully transition to [facility].  Will continue to benefit from PT to maximize function.    Personal Factors and Comorbidities Comorbidity 1    Comorbidities HTN    Examination-Activity Limitations Bed Mobility;Sleep;Squat;Stairs;Stand;Dressing;Hygiene/Grooming;Transfers;Locomotion Level    Examination-Participation Restrictions Community Activity;Driving    Stability/Clinical Decision Making Evolving/Moderate complexity    Rehab Potential Good    PT Frequency 2x / week    PT Duration 8 weeks    PT Treatment/Interventions ADLs/Self Care Home Management;Cryotherapy;Electrical Stimulation;Moist Heat;Balance training;Therapeutic exercise;Therapeutic activities;Functional mobility training;Stair training;Gait training;DME Instruction;Neuromuscular re-education;Patient/family education;Manual techniques;Vasopneumatic Device;Taping;Dry  needling;Passive range of motion    PT Next Visit  Plan aggressive ROM (flexion focus), gait training with cane when able; quad activation (trial of russian)    PT Home Exercise Plan Access Code: [email or web address]    Consulted and Agree with Plan of Care Patient           Patient will benefit from skilled therapeutic intervention in order to improve the following deficits and impairments:  Abnormal gait,Increased edema,Decreased knowledge of use of DME,Decreased strength,Pain,Increased muscle spasms,Difficulty walking,Decreased mobility,Decreased balance,Decreased range of motion  Visit Diagnosis: Difficulty walking  Muscle weakness (generalized)  Localized edema  Stiffness of right knee, not elsewhere classified  Acute pain of right knee  Other abnormalities of gait and mobility     Problem List Patient Active Problem List   Diagnosis Date Noted  . Status post total right knee replacement 10/08/2020  . Unilateral primary osteoarthritis, right knee 02/13/2018  . Status post arthroscopy of right knee 02/13/2018  . Old complex tear of lateral meniscus of right knee 08/08/2017  . Chronic pain of right knee 03/05/2017      [name], PT, DPT 11/10/20 12:18 PM     [facility] [address] [place], [place], [address] Phone: [phone]   Fax:  [phone]  Name: CASIE STURGEON MRN: [id number] Date of Birth: 1956-05-10

## 2020-11-11 ENCOUNTER — Telehealth: Payer: Self-pay | Admitting: Orthopaedic Surgery

## 2020-11-11 MED ORDER — METHOCARBAMOL 500 MG PO TABS: 500.0000 mg | ORAL_TABLET | Freq: Four times a day (QID) | ORAL | 1 refills | Status: AC | PRN

## 2020-11-11 MED ORDER — HYDROMORPHONE HCL 2 MG PO TABS: 2.0000 mg | ORAL_TABLET | Freq: Four times a day (QID) | ORAL | 0 refills | Status: DC | PRN

## 2020-11-11 NOTE — Telephone Encounter (Signed)
Patient called requesting refills of pain medication and muscle relaxer. Please send to pharmacy on file. Patient phone number is [phone].

## 2020-11-17 ENCOUNTER — Encounter: Payer: Self-pay | Admitting: Physical Therapy

## 2020-11-17 ENCOUNTER — Encounter: Payer: Self-pay | Admitting: Orthopaedic Surgery

## 2020-11-17 ENCOUNTER — Ambulatory Visit (INDEPENDENT_AMBULATORY_CARE_PROVIDER_SITE_OTHER): Payer: Medicare Other | Admitting: Orthopaedic Surgery

## 2020-11-17 ENCOUNTER — Ambulatory Visit (INDEPENDENT_AMBULATORY_CARE_PROVIDER_SITE_OTHER): Payer: Medicare Other | Admitting: Physical Therapy

## 2020-11-17 ENCOUNTER — Other Ambulatory Visit: Payer: Self-pay

## 2020-11-17 DIAGNOSIS — R600 Localized edema: Secondary | ICD-10-CM | POA: Diagnosis not present

## 2020-11-17 DIAGNOSIS — M25661 Stiffness of right knee, not elsewhere classified: Secondary | ICD-10-CM | POA: Diagnosis not present

## 2020-11-17 DIAGNOSIS — Z96651 Presence of right artificial knee joint: Secondary | ICD-10-CM

## 2020-11-17 DIAGNOSIS — R262 Difficulty in walking, not elsewhere classified: Secondary | ICD-10-CM

## 2020-11-17 DIAGNOSIS — M6281 Muscle weakness (generalized): Secondary | ICD-10-CM | POA: Diagnosis not present

## 2020-11-17 DIAGNOSIS — M25561 Pain in right knee: Secondary | ICD-10-CM

## 2020-11-17 DIAGNOSIS — R2689 Other abnormalities of gait and mobility: Secondary | ICD-10-CM

## 2020-11-17 NOTE — Therapy (Signed)
[place], [place], [address] Phone: [phone]   Fax:  [phone]  Physical Therapy Treatment  Patient Details  Name: Nancy Lee MRN: [id number] Date of Birth: 01/31/1956 Referring Provider (PT): Mcarthur Rossetti, MD   Encounter Date: 11/17/2020   PT End of Session - 11/17/20 1202    Visit Number 7    Number of Visits 16    Date for PT Re-Evaluation 12/16/20    PT Start Time 1105    PT Stop Time 1155    PT Time Calculation (min) 50 min    Activity Tolerance Patient tolerated treatment well    Behavior During Therapy [facility] for tasks assessed/performed           Past Medical History:  Diagnosis Date  . Allergy   . Arthritis   . GERD (gastroesophageal reflux disease)   . History of kidney stones   . Hyperlipidemia   . Hypertension   . Hypothyroidism     Past Surgical History:  Procedure Laterality Date  . ABDOMINAL HYSTERECTOMY  2000   fibroids  . BUNIONECTOMY    . CHOLECYSTECTOMY  2007   gallstones  . DILATION AND CURETTAGE OF UTERUS    . DILATION AND CURETTE    . THYROIDECTOMY  05/02/2011   Procedure: THYROIDECTOMY;  Surgeon: Joyice Faster. Cornett, MD;  Location: [place];  Service: General;  Laterality: Bilateral;  . TONSILLECTOMY     as a child  . TONSILLECTOMY AND ADENOIDECTOMY    . TOTAL KNEE ARTHROPLASTY Right 10/08/2020   Procedure: RIGHT TOTAL KNEE ARTHROPLASTY;  Surgeon: Mcarthur Rossetti, MD;  Location: [facility];  Service: Orthopedics;  Laterality: Right;  Needs RNFA  . TOTAL THYROIDECTOMY  05/02/2011    There were no vitals filed for this visit.   Subjective Assessment - 11/17/20 1108    Subjective Dr. Ninfa Linden doesn't want to do manuipulation - wants to see her back in 3 weeks    Limitations Standing;Walking    How long can you sit comfortably? 30 minutes    How long can you stand comfortably? 10 minutes    How long can you walk comfortably? 10 minutes    Patient Stated Goals  improve pain and mobility, sleep better    Currently in Pain? Yes    Pain Score 8     Pain Location Knee    Pain Orientation Right    Pain Descriptors / Indicators Aching;Sore;Tightness    Pain Type Acute pain;Surgical pain    Pain Onset 1 to 4 weeks ago    Pain Frequency Constant    Aggravating Factors  static positioning, weight bearing, end ranges    Pain Relieving Factors changing positions, meds                             [facility] Adult PT Treatment/Exercise - 11/17/20 1109      Knee/Hip Exercises: Stretches   Knee: Self-Stretch to increase Flexion Right;10 seconds   10 reps   Knee: Self-Stretch Limitations with LLE overpressure      Knee/Hip Exercises: Aerobic   Nustep L7 x 10 min - flexion focus      Knee/Hip Exercises: Seated   Long Arc Quad Right    Long Arc Quad Limitations x 5 min with [place]      Knee/Hip Exercises: Supine   Psychiatrist Sets Limitations x 5 min with russian  Short Arc Radio producer Limitations in long sitting with [place] - able to achieve near full extension without assist      Warden/ranger Parameters 10/10 up to 87mA    Printmaker Goals Strength;Neuromuscular facilitation      Manual Therapy   Manual therapy comments seated flexion, contract relax                    PT Short Term Goals - 10/26/20 1427      PT SHORT TERM GOAL #1   Title Independent with initial HEP    Time 4    Period Weeks    Status On-going    Target Date 11/18/20      PT SHORT TERM GOAL #2   Title Rt knee AROM improved 0-90 for improved function    Time 4    Period Weeks    Status On-going    Target Date 11/18/20             PT Long Term Goals - 10/26/20 1432      PT LONG TERM  GOAL #1   Title Independent with final HEP    Time 8    Period Weeks    Status On-going      PT LONG TERM GOAL #2   Title Rt knee AROM improved 0-110 for improved function    Time 8    Period Weeks    Status On-going      PT LONG TERM GOAL #3   Title Amb independently without significant deviations for improved function    Time 8    Period Weeks    Status On-going      PT LONG TERM GOAL #4   Title Report pain < 4/10 for improved function    Time 8    Period Weeks    Status On-going      PT LONG TERM GOAL #5   Title FOTO score improved to 66 for improved function    Time 8    Period Weeks    Status On-going                 Plan - 11/17/20 1203    Clinical Impression Statement Pt tolerated session well today with good quad response from [place] today.  Will continue to benefit from PT to maximize function.    Personal Factors and Comorbidities Comorbidity 1    Comorbidities HTN    Examination-Activity Limitations Bed Mobility;Sleep;Squat;Stairs;Stand;Dressing;Hygiene/Grooming;Transfers;Locomotion Level    Examination-Participation Restrictions Community Activity;Driving    Stability/Clinical Decision Making Evolving/Moderate complexity    Rehab Potential Good    PT Frequency 2x / week    PT Duration 8 weeks    PT Treatment/Interventions ADLs/Self Care Home Management;Cryotherapy;Electrical Stimulation;Moist Heat;Balance training;Therapeutic exercise;Therapeutic activities;Functional mobility training;Stair training;Gait training;DME Instruction;Neuromuscular re-education;Patient/family education;Manual techniques;Vasopneumatic Device;Taping;Dry needling;Passive range of motion    PT Next Visit Plan aggressive ROM (flexion focus), gait training with cane when able; quad activation (continue russian PRN), needs STGs assessed    PT Home Exercise Plan Access Code: [email or web address]    Consulted and Agree with Plan of Care Patient           Patient will benefit from  skilled therapeutic intervention in order to improve the following deficits and  impairments:  Abnormal gait,Increased edema,Decreased knowledge of use of DME,Decreased strength,Pain,Increased muscle spasms,Difficulty walking,Decreased mobility,Decreased balance,Decreased range of motion  Visit Diagnosis: Difficulty walking  Muscle weakness (generalized)  Localized edema  Stiffness of right knee, not elsewhere classified  Acute pain of right knee  Other abnormalities of gait and mobility     Problem List Patient Active Problem List   Diagnosis Date Noted  . Status post total right knee replacement 10/08/2020  . Unilateral primary osteoarthritis, right knee 02/13/2018  . Status post arthroscopy of right knee 02/13/2018  . Old complex tear of lateral meniscus of right knee 08/08/2017  . Chronic pain of right knee 03/05/2017      [name], PT, DPT 11/17/20 12:06 PM     [facility] Physical Therapy [address] [place], [place], [address] Phone: [phone]   Fax:  [phone]  Name: Nancy Lee MRN: [id number] Date of Birth: Jul 18, 1955

## 2020-11-17 NOTE — Progress Notes (Signed)
The patient is just past 5 weeks status post a right total knee replacement.  She said the furthest they have flexed her and therapy is about 100 degrees.  In the office today unable to almost fully extend her and can flex her to about 95 degrees.  The knee feels stable otherwise.  The incision looks good.  Her calf is soft.  She still ambulating with a walker.  I want her to continue to push hard on her flexion and extension.  I would like to reevaluate her in 3 weeks with a clinical exam.  All question concerns were answered and addressed.

## 2020-11-18 ENCOUNTER — Encounter: Payer: Medicare Other | Admitting: Orthopaedic Surgery

## 2020-11-18 ENCOUNTER — Ambulatory Visit: Payer: Medicare Other | Admitting: Orthopedic Surgery

## 2020-11-19 ENCOUNTER — Encounter: Payer: Self-pay | Admitting: Rehabilitative and Restorative Service Providers"

## 2020-11-19 ENCOUNTER — Other Ambulatory Visit: Payer: Self-pay

## 2020-11-19 ENCOUNTER — Ambulatory Visit (INDEPENDENT_AMBULATORY_CARE_PROVIDER_SITE_OTHER): Payer: Medicare Other | Admitting: Rehabilitative and Restorative Service Providers"

## 2020-11-19 DIAGNOSIS — M25561 Pain in right knee: Secondary | ICD-10-CM | POA: Diagnosis not present

## 2020-11-19 DIAGNOSIS — R2689 Other abnormalities of gait and mobility: Secondary | ICD-10-CM

## 2020-11-19 DIAGNOSIS — M25661 Stiffness of right knee, not elsewhere classified: Secondary | ICD-10-CM

## 2020-11-19 DIAGNOSIS — R600 Localized edema: Secondary | ICD-10-CM

## 2020-11-19 DIAGNOSIS — M6281 Muscle weakness (generalized): Secondary | ICD-10-CM

## 2020-11-19 NOTE — Therapy (Signed)
[facility] Physical Therapy [address] [place], [place], [address] Phone: [phone]   Fax:  [phone]  Physical Therapy Treatment  Patient Details  Name: Nancy Lee MRN: [id number] Date of Birth: June 11, 1956 Referring Provider (PT): Mcarthur Rossetti, MD   Encounter Date: 11/19/2020   PT End of Session - 11/19/20 1142    Visit Number 8    Number of Visits 16    Date for PT Re-Evaluation 12/16/20    PT Start Time 3009    PT Stop Time 1225    PT Time Calculation (min) 43 min    Activity Tolerance Patient tolerated treatment well    Behavior During Therapy [facility] for tasks assessed/performed           Past Medical History:  Diagnosis Date  . Allergy   . Arthritis   . GERD (gastroesophageal reflux disease)   . History of kidney stones   . Hyperlipidemia   . Hypertension   . Hypothyroidism     Past Surgical History:  Procedure Laterality Date  . ABDOMINAL HYSTERECTOMY  2000   fibroids  . BUNIONECTOMY    . CHOLECYSTECTOMY  2007   gallstones  . DILATION AND CURETTAGE OF UTERUS    . DILATION AND CURETTE    . THYROIDECTOMY  05/02/2011   Procedure: THYROIDECTOMY;  Surgeon: Joyice Faster. Cornett, MD;  Location: [place];  Service: General;  Laterality: Bilateral;  . TONSILLECTOMY     as a child  . TONSILLECTOMY AND ADENOIDECTOMY    . TOTAL KNEE ARTHROPLASTY Right 10/08/2020   Procedure: RIGHT TOTAL KNEE ARTHROPLASTY;  Surgeon: Mcarthur Rossetti, MD;  Location: [facility];  Service: Orthopedics;  Laterality: Right;  Needs RNFA  . TOTAL THYROIDECTOMY  05/02/2011    There were no vitals filed for this visit.   Subjective Assessment - 11/19/20 1145    Subjective Pt. indicated she was up early this morning due to pain and stiffness, getting ice on it and doing some of the motion exercises.  Upon arrival to clinic, no specific pain was reported, just tightness.    Limitations Standing;Walking    Patient Stated Goals improve pain and mobility,  sleep better    Currently in Pain? No/denies    Pain Score 0-No pain    Pain Location Knee    Pain Orientation Right    Pain Onset 1 to 4 weeks ago                             [facility] Adult PT Treatment/Exercise - 11/19/20 0001      Neuro Re-ed    Neuro Re-ed Details  Long sitting quad set 5 mins (10 sec on /off c estim), SAQ c foam roller behind knee 5 mins (10 seconds on /off c estim) to improve neuromuscular re-education/activation      Knee/Hip Exercises: Aerobic   Nustep L7 x 10 min - flexion focus      Knee/Hip Exercises: Supine   Quad Sets Limitations billed in neuro re-ed    Short Arc [employer] Limitations billed in neuro re-ed      Acupuncturist Location Rt quad    Warden/ranger Parameters 10 sec on /off, intensity to tolerance    Electrical Stimulation Goals Strength;Neuromuscular facilitation      Manual Therapy   Manual therapy comments seated Rt knee mobilization c movement c flexion, IR,  distraction c contralteral leg movement opposite                    PT Short Term Goals - 11/19/20 1229      PT SHORT TERM GOAL #1   Title Independent with initial HEP    Time 4    Period Weeks    Status Achieved    Target Date 11/18/20      PT SHORT TERM GOAL #2   Title Rt knee AROM improved 0-90 for improved function    Time 4    Period Weeks    Status Partially Met    Target Date 11/18/20             PT Long Term Goals - 10/26/20 1432      PT LONG TERM GOAL #1   Title Independent with final HEP    Time 8    Period Weeks    Status On-going      PT LONG TERM GOAL #2   Title Rt knee AROM improved 0-110 for improved function    Time 8    Period Weeks    Status On-going      PT LONG TERM GOAL #3   Title Amb independently without significant deviations for improved function    Time 8    Period Weeks    Status On-going      PT LONG TERM GOAL  #4   Title Report pain < 4/10 for improved function    Time 8    Period Weeks    Status On-going      PT LONG TERM GOAL #5   Title FOTO score improved to 66 for improved function    Time 8    Period Weeks    Status On-going                 Plan - 11/19/20 1227    Clinical Impression Statement Continued edema noted in Rt knee/LE at this time.  FWW use in clinic c maintained knee flexion in stance on Rt leg.  Pt. again tolerated active extension neuro re-ed c NMES fair to good today.    Personal Factors and Comorbidities Comorbidity 1    Comorbidities HTN    Examination-Activity Limitations Bed Mobility;Sleep;Squat;Stairs;Stand;Dressing;Hygiene/Grooming;Transfers;Locomotion Level    Examination-Participation Restrictions Community Activity;Driving    Stability/Clinical Decision Making Evolving/Moderate complexity    Rehab Potential Good    PT Frequency 2x / week    PT Duration 8 weeks    PT Treatment/Interventions ADLs/Self Care Home Management;Cryotherapy;Electrical Stimulation;Moist Heat;Balance training;Therapeutic exercise;Therapeutic activities;Functional mobility training;Stair training;Gait training;DME Instruction;Neuromuscular re-education;Patient/family education;Manual techniques;Vasopneumatic Device;Taping;Dry needling;Passive range of motion    PT Next Visit Plan Continued manual intervention for flexion, training with cane when able; quad activation (continue russian PRN),    PT Home Exercise Plan Access Code: [email or web address]    Consulted and Agree with Plan of Care Patient           Patient will benefit from skilled therapeutic intervention in order to improve the following deficits and impairments:  Abnormal gait,Increased edema,Decreased knowledge of use of DME,Decreased strength,Pain,Increased muscle spasms,Difficulty walking,Decreased mobility,Decreased balance,Decreased range of motion  Visit Diagnosis: Acute pain of right knee  Stiffness of right knee, not  elsewhere classified  Other abnormalities of gait and mobility  Localized edema  Muscle weakness (generalized)     Problem List Patient Active Problem List   Diagnosis Date Noted  . Status post total right knee replacement 10/08/2020  .  Unilateral primary osteoarthritis, right knee 02/13/2018  . Status post arthroscopy of right knee 02/13/2018  . Old complex tear of lateral meniscus of right knee 08/08/2017  . Chronic pain of right knee 03/05/2017    Scot Jun, PT, DPT, OCS, ATC 11/19/20  12:30 PM    [facility] [address] [place], [place], [address] Phone: [phone]   Fax:  [phone]  Name: JADAYA SOMMERFIELD MRN: [id number] Date of Birth: 1955-09-10

## 2020-11-24 ENCOUNTER — Encounter: Payer: Self-pay | Admitting: Physical Therapy

## 2020-11-24 ENCOUNTER — Other Ambulatory Visit: Payer: Self-pay

## 2020-11-24 ENCOUNTER — Ambulatory Visit (INDEPENDENT_AMBULATORY_CARE_PROVIDER_SITE_OTHER): Payer: Medicare Other | Admitting: Physical Therapy

## 2020-11-24 DIAGNOSIS — R600 Localized edema: Secondary | ICD-10-CM

## 2020-11-24 DIAGNOSIS — M25661 Stiffness of right knee, not elsewhere classified: Secondary | ICD-10-CM

## 2020-11-24 DIAGNOSIS — R2689 Other abnormalities of gait and mobility: Secondary | ICD-10-CM

## 2020-11-24 DIAGNOSIS — M25561 Pain in right knee: Secondary | ICD-10-CM | POA: Diagnosis not present

## 2020-11-24 DIAGNOSIS — R262 Difficulty in walking, not elsewhere classified: Secondary | ICD-10-CM

## 2020-11-24 DIAGNOSIS — M6281 Muscle weakness (generalized): Secondary | ICD-10-CM

## 2020-11-24 NOTE — Therapy (Signed)
[facility] Therapy [address] [place], [place], [address] Phone: [phone]   Fax:  [phone]  Physical Therapy Treatment  Patient Details  Name: Nancy Lee MRN: [id number] Date of Birth: 1955/09/20 Referring Provider (PT): Mcarthur Rossetti, MD   Encounter Date: 11/24/2020   PT End of Session - 11/24/20 1223    Visit Number 9    Number of Visits 16    Date for PT Re-Evaluation 12/16/20    PT Start Time 1140    PT Stop Time 1230    PT Time Calculation (min) 50 min    Activity Tolerance Patient tolerated treatment well    Behavior During Therapy [facility] for tasks assessed/performed           Past Medical History:  Diagnosis Date  . Allergy   . Arthritis   . GERD (gastroesophageal reflux disease)   . History of kidney stones   . Hyperlipidemia   . Hypertension   . Hypothyroidism     Past Surgical History:  Procedure Laterality Date  . ABDOMINAL HYSTERECTOMY  2000   fibroids  . BUNIONECTOMY    . CHOLECYSTECTOMY  2007   gallstones  . DILATION AND CURETTAGE OF UTERUS    . DILATION AND CURETTE    . THYROIDECTOMY  05/02/2011   Procedure: THYROIDECTOMY;  Surgeon: Joyice Faster. Cornett, MD;  Location: [place];  Service: General;  Laterality: Bilateral;  . TONSILLECTOMY     as a child  . TONSILLECTOMY AND ADENOIDECTOMY    . TOTAL KNEE ARTHROPLASTY Right 10/08/2020   Procedure: RIGHT TOTAL KNEE ARTHROPLASTY;  Surgeon: Mcarthur Rossetti, MD;  Location[facility];  Service: Orthopedics;  Laterality: Right;  Needs RNFA  . TOTAL THYROIDECTOMY  05/02/2011    There were no vitals filed for this visit.   Subjective Assessment - 11/24/20 1144    Subjective ice machine is broken so she hasn't been able to use it for a couple of days, and company is sending a new one.    Limitations Standing;Walking    Patient Stated Goals improve pain and mobility, sleep better    Currently in Pain? Yes    Pain Score 7     Pain Location Knee    Pain  Orientation Right    Pain Descriptors / Indicators Aching;Sore;Tightness    Pain Type Acute pain;Surgical pain    Pain Onset 1 to 4 weeks ago    Pain Frequency Constant    Aggravating Factors  static positioning, weight bearing, end ranges    Pain Relieving Factors changing positions, meds              [facility] PT Assessment - 11/24/20 1217      Assessment   Medical Diagnosis M17.11 (ICD-10-CM) - Unilateral primary osteoarthritis, right knee    Referring Provider (PT) Mcarthur Rossetti, MD    Onset Date/Surgical Date 10/08/20      AROM   Right Knee Flexion 99      PROM   Right Knee Flexion 102                         [facility] Adult PT Treatment/Exercise - 11/24/20 1146      Knee/Hip Exercises: Stretches   Knee: Self-Stretch to increase Flexion Right;10 seconds   10 reps   Knee: Self-Stretch Limitations with LLE overpressure      Knee/Hip Exercises: Aerobic   Recumbent Bike partial revolutions x 8 min; seat 4  Knee/Hip Exercises: Supine   Quad Sets Strengthening;Right    Quad Sets Limitations x 5 min with russian      Sports coach Parameters 10 sec on /off, intensity to tolerance    Electrical Stimulation Goals Strength;Neuromuscular facilitation      Vasopneumatic   Number Minutes Vasopneumatic  10 minutes    Vasopnuematic Location  Knee    Vasopneumatic Pressure Medium    Vasopneumatic Temperature  34      Manual Therapy   Manual therapy comments seated Rt knee mobilization c movement c flexion, IR, distraction c contralteral leg movement opposite                    PT Short Term Goals - 11/24/20 1223      PT SHORT TERM GOAL #1   Title Independent with initial HEP    Time 4    Period Weeks    Status Achieved    Target Date 11/18/20      PT SHORT TERM GOAL #2   Title Rt knee AROM improved 0-90 for  improved function    Time 4    Period Weeks    Status Achieved    Target Date 11/18/20             PT Long Term Goals - 10/26/20 1432      PT LONG TERM GOAL #1   Title Independent with final HEP    Time 8    Period Weeks    Status On-going      PT LONG TERM GOAL #2   Title Rt knee AROM improved 0-110 for improved function    Time 8    Period Weeks    Status On-going      PT LONG TERM GOAL #3   Title Amb independently without significant deviations for improved function    Time 8    Period Weeks    Status On-going      PT LONG TERM GOAL #4   Title Report pain < 4/10 for improved function    Time 8    Period Weeks    Status On-going      PT LONG TERM GOAL #5   Title FOTO score improved to 66 for improved function    Time 8    Period Weeks    Status On-going                 Plan - 11/24/20 1223    Clinical Impression Statement Pt still with limited quad activation so continued with NMES as well as focus on increasing flexion as much as possible.  Slow progress noted with flexion.  Will continue to benefit from PT to maximize function.    Personal Factors and Comorbidities Comorbidity 1    Comorbidities HTN    Examination-Activity Limitations Bed Mobility;Sleep;Squat;Stairs;Stand;Dressing;Hygiene/Grooming;Transfers;Locomotion Level    Examination-Participation Restrictions Community Activity;Driving    Stability/Clinical Decision Making Evolving/Moderate complexity    Rehab Potential Good    PT Frequency 2x / week    PT Duration 8 weeks    PT Treatment/Interventions ADLs/Self Care Home Management;Cryotherapy;Electrical Stimulation;Moist Heat;Balance training;Therapeutic exercise;Therapeutic activities;Functional mobility training;Stair training;Gait training;DME Instruction;Neuromuscular re-education;Patient/family education;Manual techniques;Vasopneumatic Device;Taping;Dry needling;Passive range of motion    PT Next Visit Plan Continued manual intervention  for flexion, training with cane when able; quad activation (continue russian PRN), 10th visit, FOTO  PT Home Exercise Plan Access Code: [email or web address]    Consulted and Agree with Plan of Care Patient           Patient will benefit from skilled therapeutic intervention in order to improve the following deficits and impairments:  Abnormal gait,Increased edema,Decreased knowledge of use of DME,Decreased strength,Pain,Increased muscle spasms,Difficulty walking,Decreased mobility,Decreased balance,Decreased range of motion  Visit Diagnosis: Acute pain of right knee  Stiffness of right knee, not elsewhere classified  Other abnormalities of gait and mobility  Localized edema  Muscle weakness (generalized)  Difficulty walking     Problem List Patient Active Problem List   Diagnosis Date Noted  . Status post total right knee replacement 10/08/2020  . Unilateral primary osteoarthritis, right knee 02/13/2018  . Status post arthroscopy of right knee 02/13/2018  . Old complex tear of lateral meniscus of right knee 08/08/2017  . Chronic pain of right knee 03/05/2017     [name], PT, DPT 11/24/20 12:28 PM    [facility] Physical Therapy [address] [place], [place], [address] Phone: [phone]   Fax:  [phone]  Name: Nancy Lee MRN: [id number] Date of Birth: Oct 04, 1955

## 2020-11-26 ENCOUNTER — Ambulatory Visit (INDEPENDENT_AMBULATORY_CARE_PROVIDER_SITE_OTHER): Payer: Medicare Other | Admitting: Rehabilitative and Restorative Service Providers"

## 2020-11-26 ENCOUNTER — Encounter: Payer: Self-pay | Admitting: Rehabilitative and Restorative Service Providers"

## 2020-11-26 ENCOUNTER — Other Ambulatory Visit: Payer: Self-pay

## 2020-11-26 DIAGNOSIS — M25661 Stiffness of right knee, not elsewhere classified: Secondary | ICD-10-CM | POA: Diagnosis not present

## 2020-11-26 DIAGNOSIS — R600 Localized edema: Secondary | ICD-10-CM

## 2020-11-26 DIAGNOSIS — R262 Difficulty in walking, not elsewhere classified: Secondary | ICD-10-CM | POA: Diagnosis not present

## 2020-11-26 DIAGNOSIS — M6281 Muscle weakness (generalized): Secondary | ICD-10-CM | POA: Diagnosis not present

## 2020-11-26 NOTE — Therapy (Addendum)
[facility] Physical Therapy [address] [place], [place], [address] Phone: [phone]   Fax:  [phone]  Physical Therapy Treatment Progress Note Reporting Period 10/21/2020 to 11/26/2020  See note below for Objective Data and Assessment of Progress/Goals.      Patient Details  Name: Nancy Lee MRN: [id number] Date of Birth: 19-Nov-1955 Referring Provider (PT): Mcarthur Rossetti, MD   Encounter Date: 11/26/2020   PT End of Session - 11/26/20 1748     Visit Number 10    Number of Visits 16    Date for PT Re-Evaluation 12/16/20    PT Start Time 7782    PT Stop Time 4235    PT Time Calculation (min) 55 min    Activity Tolerance Patient tolerated treatment well;Patient limited by fatigue    Behavior During Therapy [facility] for tasks assessed/performed             Past Medical History:  Diagnosis Date   Allergy    Arthritis    GERD (gastroesophageal reflux disease)    History of kidney stones    Hyperlipidemia    Hypertension    Hypothyroidism     Past Surgical History:  Procedure Laterality Date   ABDOMINAL HYSTERECTOMY  2000   fibroids   BUNIONECTOMY     CHOLECYSTECTOMY  2007   gallstones   DILATION AND CURETTAGE OF UTERUS     DILATION AND CURETTE     THYROIDECTOMY  05/02/2011   Procedure: THYROIDECTOMY;  Surgeon: Joyice Faster. Cornett, MD;  Location: [place];  Service: General;  Laterality: Bilateral;   TONSILLECTOMY     as a child   TONSILLECTOMY AND ADENOIDECTOMY     TOTAL KNEE ARTHROPLASTY Right 10/08/2020   Procedure: RIGHT TOTAL KNEE ARTHROPLASTY;  Surgeon: Mcarthur Rossetti, MD;  Location: [facility];  Service: Orthopedics;  Laterality: Right;  Needs RNFA   TOTAL THYROIDECTOMY  05/02/2011    There were no vitals filed for this visit.   Subjective Assessment - 11/26/20 1155     Subjective Alean notes overall progress with her AROM.  She is using the cane in the house and walker in the community.  The knee still affects  sleep, but not every night.    Limitations Standing;Walking    How long can you sit comfortably? A few hours but some start-up stiffness    How long can you stand comfortably? 15 minutes    How long can you walk comfortably? 10-15 minutes    Patient Stated Goals improve pain and mobility, sleep better    Currently in Pain? Yes    Pain Score 3     Pain Location Knee    Pain Orientation Right    Pain Descriptors / Indicators Tightness;Tingling;Aching    Pain Type Surgical pain;Chronic pain    Pain Onset More than a month ago    Pain Frequency Intermittent    Aggravating Factors  Too much WB or prolonged postures    Pain Relieving Factors Ice and pain pill at nigh    Effect of Pain on Daily Activities Transitioning from the walker to a cane, limited WB endurance, sleep can be interrupted    Multiple Pain Sites No                  AROM      Right Knee Flexion    99       PROM      Right Knee Flexion    102  [facility] Adult PT Treatment/Exercise - 11/26/20 0001       Therapeutic Activites    Therapeutic Activities ADL's    ADL's Step-up and over with high knee flexion and slow eccentrics 4 and 6 inch step      Neuro Re-ed    Neuro Re-ed Details  Tandem balance 5X 30 seconds and dynamic heel to toe walk in parallel bars      Exercises   Exercises Knee/Hip      Knee/Hip Exercises: Machines for Strengthening   Total Gym Leg Press 75# double leg and 37# single leg 2 sets of 10 each full extension to as much flexion as possible and slow eccentrics      Knee/Hip Exercises: Seated   Sit to Sand 5 reps;without UE support;Other (comment)   slow eccentrics     Modalities   Modalities Vasopneumatic      Vasopneumatic   Number Minutes Vasopneumatic  10 minutes    Vasopnuematic Location  Knee    Vasopneumatic Pressure Medium    Vasopneumatic Temperature  34                    PT Education - 11/26/20 1747     Education Details Reviewed HEP  with progressions with balance and stairs.    Person(s) Educated Patient    Methods Explanation;Demonstration;Verbal cues    Comprehension Verbalized understanding;Need further instruction;Returned demonstration;Verbal cues required              PT Short Term Goals - 11/26/20 1748       PT SHORT TERM GOAL #1   Title Independent with initial HEP    Time 4    Period Weeks    Status Achieved    Target Date 11/18/20      PT SHORT TERM GOAL #2   Title Rt knee AROM improved 0-90 for improved function    Time 4    Period Weeks    Status Achieved    Target Date 11/18/20               PT Long Term Goals - 11/26/20 1748       PT LONG TERM GOAL #1   Title Independent with final HEP    Time 8    Period Weeks    Status On-going      PT LONG TERM GOAL #2   Title Rt knee AROM improved 0-110 for improved function    Time 8    Period Weeks    Status On-going      PT LONG TERM GOAL #3   Title Amb independently without significant deviations for improved function    Time 8    Period Weeks    Status On-going      PT LONG TERM GOAL #4   Title Report pain < 4/10 for improved function    Time 8    Period Weeks    Status On-going      PT LONG TERM GOAL #5   Title FOTO score improved to 66 for improved function    Time 8    Period Weeks    Status On-going                   Plan - 11/26/20 1749     Clinical Impression Statement Christella was able to ascend and descend a 4 and 6 inch step with UE support and eccentric quadriceps control.  Balance and quadriceps strength were a priority today  and both will benefit from continued work.  [place] is transitioning from a walker to a cane and will benefit from continued supervised PT to meet long-term goals.    Personal Factors and Comorbidities Comorbidity 1    Comorbidities HTN    Examination-Activity Limitations Bed Mobility;Sleep;Squat;Stairs;Stand;Dressing;Hygiene/Grooming;Transfers;Locomotion Level     Examination-Participation Restrictions Community Activity;Driving    Stability/Clinical Decision Making Evolving/Moderate complexity    Rehab Potential Good    PT Frequency 2x / week    PT Duration 8 weeks    PT Treatment/Interventions ADLs/Self Care Home Management;Cryotherapy;Electrical Stimulation;Moist Heat;Balance training;Therapeutic exercise;Therapeutic activities;Functional mobility training;Stair training;Gait training;DME Instruction;Neuromuscular re-education;Patient/family education;Manual techniques;Vasopneumatic Device;Taping;Dry needling;Passive range of motion    PT Next Visit Plan Quadriceps strength, practical work with stairs, proprioception and balance    PT Home Exercise Plan Access Code: [email or web address]    Consulted and Agree with Plan of Care Patient             Patient will benefit from skilled therapeutic intervention in order to improve the following deficits and impairments:  Abnormal gait, Increased edema, Decreased knowledge of use of DME, Decreased strength, Pain, Increased muscle spasms, Difficulty walking, Decreased mobility, Decreased balance, Decreased range of motion  Visit Diagnosis: Difficulty walking  Muscle weakness (generalized)  Localized edema  Stiffness of right knee, not elsewhere classified     Problem List Patient Active Problem List   Diagnosis Date Noted   Status post total right knee replacement 10/08/2020   Unilateral primary osteoarthritis, right knee 02/13/2018   Status post arthroscopy of right knee 02/13/2018   Old complex tear of lateral meniscus of right knee 08/08/2017   Chronic pain of right knee 03/05/2017    Farley Ly PT, MPT 11/26/2020, 5:52 PM  [facility] Physical Therapy [address] [place], [place], [address] Phone: [phone]   Fax:  [phone]  Name: KIMBER FRITTS MRN: [id number] Date of Birth: [birth year]

## 2020-12-01 ENCOUNTER — Ambulatory Visit (INDEPENDENT_AMBULATORY_CARE_PROVIDER_SITE_OTHER): Payer: Medicare Other | Admitting: Physical Therapy

## 2020-12-01 ENCOUNTER — Other Ambulatory Visit: Payer: Self-pay

## 2020-12-01 ENCOUNTER — Encounter: Payer: Self-pay | Admitting: Physical Therapy

## 2020-12-01 DIAGNOSIS — R262 Difficulty in walking, not elsewhere classified: Secondary | ICD-10-CM | POA: Diagnosis not present

## 2020-12-01 DIAGNOSIS — R600 Localized edema: Secondary | ICD-10-CM | POA: Diagnosis not present

## 2020-12-01 DIAGNOSIS — M25661 Stiffness of right knee, not elsewhere classified: Secondary | ICD-10-CM | POA: Diagnosis not present

## 2020-12-01 DIAGNOSIS — M6281 Muscle weakness (generalized): Secondary | ICD-10-CM | POA: Diagnosis not present

## 2020-12-01 DIAGNOSIS — R2689 Other abnormalities of gait and mobility: Secondary | ICD-10-CM

## 2020-12-01 DIAGNOSIS — M25561 Pain in right knee: Secondary | ICD-10-CM

## 2020-12-01 NOTE — Therapy (Signed)
[facility] Physical Therapy [address] [place], [place], [address] Phone: [phone]   Fax:  [phone]  Physical Therapy Treatment  Patient Details  Name: Nancy Lee MRN: [id number] Date of Birth: [birth year] Referring Provider (PT): Mcarthur Rossetti, MD   Encounter Date: 12/01/2020   PT End of Session - 12/01/20 1301     Visit Number 11    Number of Visits 16    Date for PT Re-Evaluation 12/16/20    PT Start Time 1140    PT Stop Time 1223    PT Time Calculation (min) 43 min    Activity Tolerance Patient tolerated treatment well;Patient limited by fatigue    Behavior During Therapy [facility] for tasks assessed/performed             Past Medical History:  Diagnosis Date   Allergy    Arthritis    GERD (gastroesophageal reflux disease)    History of kidney stones    Hyperlipidemia    Hypertension    Hypothyroidism     Past Surgical History:  Procedure Laterality Date   ABDOMINAL HYSTERECTOMY  2000   fibroids   BUNIONECTOMY     CHOLECYSTECTOMY  2007   gallstones   DILATION AND CURETTAGE OF UTERUS     DILATION AND CURETTE     THYROIDECTOMY  05/02/2011   Procedure: THYROIDECTOMY;  Surgeon: Joyice Faster. Cornett, MD;  Location: [place];  Service: General;  Laterality: Bilateral;   TONSILLECTOMY     as a child   TONSILLECTOMY AND ADENOIDECTOMY     TOTAL KNEE ARTHROPLASTY Right 10/08/2020   Procedure: RIGHT TOTAL KNEE ARTHROPLASTY;  Surgeon: Mcarthur Rossetti, MD;  [facility];  Service: Orthopedics;  Laterality: Right;  Needs RNFA   TOTAL THYROIDECTOMY  05/02/2011    There were no vitals filed for this visit.   Subjective Assessment - 12/01/20 1142     Subjective knee is just a little sore - wakes her up at night    Limitations Standing;Walking    How long can you sit comfortably? A few hours but some start-up stiffness    How long can you stand comfortably? 15 minutes    How long can you walk comfortably? 10-15 minutes     Patient Stated Goals improve pain and mobility, sleep better    Currently in Pain? Yes    Pain Score 0-No pain    Pain Location Knee    Pain Orientation Right    Pain Descriptors / Indicators Tightness                               [facility] Adult PT Treatment/Exercise - 12/01/20 1147       Knee/Hip Exercises: Stretches   Knee: Self-Stretch to increase Flexion Right;10 seconds   10 reps   Knee: Self-Stretch Limitations with LLE overpressure      Knee/Hip Exercises: Aerobic   Nustep L7 x 10 min - flexion focus      Knee/Hip Exercises: Machines for Strengthening   Total Gym Leg Press 81# 3x10 bil push; then single limb RLE 43# 3x10      Knee/Hip Exercises: Seated   Long Arc Quad Right;3 sets;10 reps                      PT Short Term Goals - 11/26/20 1748       PT SHORT TERM GOAL #1   Title Independent  with initial HEP    Time 4    Period Weeks    Status Achieved    Target Date 11/18/20      PT SHORT TERM GOAL #2   Title Rt knee AROM improved 0-90 for improved function    Time 4    Period Weeks    Status Achieved    Target Date 11/18/20               PT Long Term Goals - 11/26/20 1748       PT LONG TERM GOAL #1   Title Independent with final HEP    Time 8    Period Weeks    Status On-going      PT LONG TERM GOAL #2   Title Rt knee AROM improved 0-110 for improved function    Time 8    Period Weeks    Status On-going      PT LONG TERM GOAL #3   Title Amb independently without significant deviations for improved function    Time 8    Period Weeks    Status On-going      PT LONG TERM GOAL #4   Title Report pain < 4/10 for improved function    Time 8    Period Weeks    Status On-going      PT LONG TERM GOAL #5   Title FOTO score improved to 66 for improved function    Time 8    Period Weeks    Status On-going                   Plan - 12/01/20 1302     Clinical Impression Statement Pt tolerated  session well today and continues to make steady progress towards her goals.  She continues to demonstrate decreased quad strength which is improving over time.    Personal Factors and Comorbidities Comorbidity 1    Comorbidities HTN    Examination-Activity Limitations Bed Mobility;Sleep;Squat;Stairs;Stand;Dressing;Hygiene/Grooming;Transfers;Locomotion Level    Examination-Participation Restrictions Community Activity;Driving    Stability/Clinical Decision Making Evolving/Moderate complexity    Rehab Potential Good    PT Frequency 2x / week    PT Duration 8 weeks    PT Treatment/Interventions ADLs/Self Care Home Management;Cryotherapy;Electrical Stimulation;Moist Heat;Balance training;Therapeutic exercise;Therapeutic activities;Functional mobility training;Stair training;Gait training;DME Instruction;Neuromuscular re-education;Patient/family education;Manual techniques;Vasopneumatic Device;Taping;Dry needling;Passive range of motion    PT Next Visit Plan Quadriceps strength, practical work with stairs, proprioception and balance    PT Home Exercise Plan Access Code: [email or web address]    Consulted and Agree with Plan of Care Patient             Patient will benefit from skilled therapeutic intervention in order to improve the following deficits and impairments:  Abnormal gait, Increased edema, Decreased knowledge of use of DME, Decreased strength, Pain, Increased muscle spasms, Difficulty walking, Decreased mobility, Decreased balance, Decreased range of motion  Visit Diagnosis: Difficulty walking  Muscle weakness (generalized)  Stiffness of right knee, not elsewhere classified  Localized edema  Acute pain of right knee  Other abnormalities of gait and mobility     Problem List Patient Active Problem List   Diagnosis Date Noted   Status post total right knee replacement 10/08/2020   Unilateral primary osteoarthritis, right knee 02/13/2018   Status post arthroscopy of right knee  02/13/2018   Old complex tear of lateral meniscus of right knee 08/08/2017   Chronic pain of right knee 03/05/2017      [name],  PT, DPT 12/01/20 1:03 PM      [facility] Therapy [address] [place], [place], [address] Phone: [phone]   Fax:  [phone]  Name: TALON WITTING MRN: [id number] Date of Birth: 1956-02-12

## 2020-12-03 ENCOUNTER — Ambulatory Visit (INDEPENDENT_AMBULATORY_CARE_PROVIDER_SITE_OTHER): Payer: Medicare Other | Admitting: Physical Therapy

## 2020-12-03 ENCOUNTER — Encounter: Payer: Self-pay | Admitting: Physical Therapy

## 2020-12-03 ENCOUNTER — Other Ambulatory Visit: Payer: Self-pay

## 2020-12-03 DIAGNOSIS — M6281 Muscle weakness (generalized): Secondary | ICD-10-CM

## 2020-12-03 DIAGNOSIS — R600 Localized edema: Secondary | ICD-10-CM

## 2020-12-03 DIAGNOSIS — R262 Difficulty in walking, not elsewhere classified: Secondary | ICD-10-CM

## 2020-12-03 DIAGNOSIS — M25661 Stiffness of right knee, not elsewhere classified: Secondary | ICD-10-CM | POA: Diagnosis not present

## 2020-12-03 NOTE — Therapy (Addendum)
[facility] Physical Therapy [address] [place], [place], [address] Phone: [phone]   Fax:  [phone]  Physical Therapy Treatment  Patient Details  Name: Nancy Lee MRN: [id number] Date of Birth: 11/06/1955 Referring Provider (PT): Mcarthur Rossetti, MD   Encounter Date: 12/03/2020   PT End of Session - 12/03/20 1109     Visit Number 12    Number of Visits 16    Date for PT Re-Evaluation 12/16/20    PT Start Time 4315    PT Stop Time 4008    PT Time Calculation (min) 43 min    Activity Tolerance Patient tolerated treatment well;Patient limited by fatigue    Behavior During Therapy [facility] for tasks assessed/performed             Past Medical History:  Diagnosis Date   Allergy    Arthritis    GERD (gastroesophageal reflux disease)    History of kidney stones    Hyperlipidemia    Hypertension    Hypothyroidism     Past Surgical History:  Procedure Laterality Date   ABDOMINAL HYSTERECTOMY  2000   fibroids   BUNIONECTOMY     CHOLECYSTECTOMY  2007   gallstones   DILATION AND CURETTAGE OF UTERUS     DILATION AND CURETTE     THYROIDECTOMY  05/02/2011   Procedure: THYROIDECTOMY;  Surgeon: Joyice Faster. Cornett, MD;  Location: [place];  Service: General;  Laterality: Bilateral;   TONSILLECTOMY     as a child   TONSILLECTOMY AND ADENOIDECTOMY     TOTAL KNEE ARTHROPLASTY Right 10/08/2020   Procedure: RIGHT TOTAL KNEE ARTHROPLASTY;  Surgeon: Mcarthur Rossetti, MD;  Location: [facility];  Service: Orthopedics;  Laterality: Right;  Needs RNFA   TOTAL THYROIDECTOMY  05/02/2011    There were no vitals filed for this visit.   Subjective Assessment - 12/03/20 1106     Subjective Pt states the knee feels sore and stiff. She feels no pain currently. She states that waking up at 3-4 am, she will feel a "ball" inside of her knee.    Limitations Standing;Walking    How long can you sit comfortably? A few hours but some start-up stiffness    How  long can you stand comfortably? 15 minutes    How long can you walk comfortably? 10-15 minutes    Patient Stated Goals improve pain and mobility, sleep better    Currently in Pain? No/denies    Pain Score 0-No pain                [facility] PT Assessment - 12/03/20 0001       AROM   Right Knee Extension -5    Right Knee Flexion 106                           [facility] Adult PT Treatment/Exercise - 12/03/20 0001       Knee/Hip Exercises: Stretches   Knee: Self-Stretch to increase Flexion Right;10 seconds   10 reps   Knee: Self-Stretch Limitations with LLE overpressure      Knee/Hip Exercises: Aerobic   Recumbent Bike seated 5, 7 min   half turns     Knee/Hip Exercises: Machines for Strengthening   Total Gym Leg Press 81# 3x10 bil push; then single limb RLE 43# 3x10      Knee/Hip Exercises: Standing   Forward Step Up Limitations 4" box in parallel bars 2x10 cued for TKE  and knee bend      Knee/Hip Exercises: Seated   Long Arc Quad Right;3 sets;10 reps    Long Arc Quad Weight 5 lbs.    Other Seated Knee/Hip Exercises seated tailgate stretch with self OP 10x 10s            Manual Therapy: joint mobilization- grade III TKE tibial ER mob        PT Education - 12/03/20 1108     Education Details anatomy, exercise progression, swelling management, DOMS expectations, muscle firing, HEP, stair management    Person(s) Educated Patient    Methods Explanation;Demonstration;Tactile cues;Verbal cues;Handout    Comprehension Verbalized understanding;Returned demonstration;Verbal cues required;Tactile cues required              PT Short Term Goals - 11/26/20 1748       PT SHORT TERM GOAL #1   Title Independent with initial HEP    Time 4    Period Weeks    Status Achieved    Target Date 11/18/20      PT SHORT TERM GOAL #2   Title Rt knee AROM improved 0-90 for improved function    Time 4    Period Weeks    Status Achieved    Target Date 11/18/20                PT Long Term Goals - 11/26/20 1748       PT LONG TERM GOAL #1   Title Independent with final HEP    Time 8    Period Weeks    Status On-going      PT LONG TERM GOAL #2   Title Rt knee AROM improved 0-110 for improved function    Time 8    Period Weeks    Status On-going      PT LONG TERM GOAL #3   Title Amb independently without significant deviations for improved function    Time 8    Period Weeks    Status On-going      PT LONG TERM GOAL #4   Title Report pain < 4/10 for improved function    Time 8    Period Weeks    Status On-going      PT LONG TERM GOAL #5   Title FOTO score improved to 66 for improved function    Time 8    Period Weeks    Status On-going                   Plan - 12/03/20 1123     Clinical Impression Statement Pt able to continue with R knee ROM and quad strengthening. Pt has R quadriceps endurance deficits that causes early onset of fatigue. Pt was able to tolerate step up exercise but required increased cuing for quad activation and reduced hip compensation. Likely will need continued instruction at next session. Pt would benefit from continued skilled therapy in order to reach goals and maximize functional R LE strength and ROM for full return to PLOF.    Personal Factors and Comorbidities Comorbidity 1    Comorbidities HTN    Examination-Activity Limitations Bed Mobility;Sleep;Squat;Stairs;Stand;Dressing;Hygiene/Grooming;Transfers;Locomotion Level    Examination-Participation Restrictions Community Activity;Driving    Stability/Clinical Decision Making Evolving/Moderate complexity    Rehab Potential Good    PT Frequency 2x / week    PT Duration 8 weeks    PT Treatment/Interventions ADLs/Self Care Home Management;Cryotherapy;Electrical Stimulation;Moist Heat;Balance training;Therapeutic exercise;Therapeutic activities;Functional mobility training;Stair training;Gait training;DME Instruction;Neuromuscular  re-education;Patient/family  education;Manual techniques;Vasopneumatic Device;Taping;Dry needling;Passive range of motion    PT Next Visit Plan Quadriceps strength, practical work with stairs, proprioception and balance, review step up    PT Home Exercise Plan Access Code: [email or web address]    Consulted and Agree with Plan of Care Patient             Patient will benefit from skilled therapeutic intervention in order to improve the following deficits and impairments:  Abnormal gait, Increased edema, Decreased knowledge of use of DME, Decreased strength, Pain, Increased muscle spasms, Difficulty walking, Decreased mobility, Decreased balance, Decreased range of motion  Visit Diagnosis: Difficulty walking  Muscle weakness (generalized)  Stiffness of right knee, not elsewhere classified  Localized edema     Problem List Patient Active Problem List   Diagnosis Date Noted   Status post total right knee replacement 10/08/2020   Unilateral primary osteoarthritis, right knee 02/13/2018   Status post arthroscopy of right knee 02/13/2018   Old complex tear of lateral meniscus of right knee 08/08/2017   Chronic pain of right knee 03/05/2017    Daleen Bo PT, DPT 12/03/20 11:57 AM   [facility] [address] [place], [place], [address] Phone: [phone]   Fax:  [phone]  Name: Nancy Lee MRN: [id number] Date of Birth: [birth year]

## 2020-12-08 ENCOUNTER — Encounter: Payer: Self-pay | Admitting: Orthopaedic Surgery

## 2020-12-08 ENCOUNTER — Other Ambulatory Visit: Payer: Self-pay

## 2020-12-08 ENCOUNTER — Ambulatory Visit (INDEPENDENT_AMBULATORY_CARE_PROVIDER_SITE_OTHER): Payer: Medicare Other | Admitting: Orthopaedic Surgery

## 2020-12-08 ENCOUNTER — Encounter: Payer: Self-pay | Admitting: Physical Therapy

## 2020-12-08 ENCOUNTER — Ambulatory Visit (INDEPENDENT_AMBULATORY_CARE_PROVIDER_SITE_OTHER): Payer: Medicare Other | Admitting: Physical Therapy

## 2020-12-08 DIAGNOSIS — R262 Difficulty in walking, not elsewhere classified: Secondary | ICD-10-CM | POA: Diagnosis not present

## 2020-12-08 DIAGNOSIS — M6281 Muscle weakness (generalized): Secondary | ICD-10-CM | POA: Diagnosis not present

## 2020-12-08 DIAGNOSIS — R600 Localized edema: Secondary | ICD-10-CM | POA: Diagnosis not present

## 2020-12-08 DIAGNOSIS — R2689 Other abnormalities of gait and mobility: Secondary | ICD-10-CM

## 2020-12-08 DIAGNOSIS — Z96651 Presence of right artificial knee joint: Secondary | ICD-10-CM

## 2020-12-08 DIAGNOSIS — M25661 Stiffness of right knee, not elsewhere classified: Secondary | ICD-10-CM

## 2020-12-08 DIAGNOSIS — M25561 Pain in right knee: Secondary | ICD-10-CM

## 2020-12-08 MED ORDER — GABAPENTIN 300 MG PO CAPS: 300.0000 mg | ORAL_CAPSULE | Freq: Every day | ORAL | 1 refills | Status: AC

## 2020-12-08 NOTE — Therapy (Signed)
[facility] Physical Therapy [address] [place], [place], [address] Phone: [phone]   Fax:  [phone]  Physical Therapy Treatment  Patient Details  Name: Nancy Lee MRN: [id number] Date of Birth: 1956/03/30 Referring Provider (PT): Mcarthur Rossetti, MD   Encounter Date: 12/08/2020   PT End of Session - 12/08/20 1153     Visit Number 13    Number of Visits 16    Date for PT Re-Evaluation 12/16/20    PT Start Time 1055    PT Stop Time 7902    PT Time Calculation (min) 46 min    Activity Tolerance Patient tolerated treatment well;Patient limited by fatigue    Behavior During Therapy [facility] for tasks assessed/performed             Past Medical History:  Diagnosis Date   Allergy    Arthritis    GERD (gastroesophageal reflux disease)    History of kidney stones    Hyperlipidemia    Hypertension    Hypothyroidism     Past Surgical History:  Procedure Laterality Date   ABDOMINAL HYSTERECTOMY  2000   fibroids   BUNIONECTOMY     CHOLECYSTECTOMY  2007   gallstones   DILATION AND CURETTAGE OF UTERUS     DILATION AND CURETTE     THYROIDECTOMY  05/02/2011   Procedure: THYROIDECTOMY;  Surgeon: Joyice Faster. Cornett, MD;  Location: [place];  Service: General;  Laterality: Bilateral;   TONSILLECTOMY     as a child   TONSILLECTOMY AND ADENOIDECTOMY     TOTAL KNEE ARTHROPLASTY Right 10/08/2020   Procedure: RIGHT TOTAL KNEE ARTHROPLASTY;  Surgeon: Mcarthur Rossetti, MD;  [facility];  Service: Orthopedics;  Laterality: Right;  Needs RNFA   TOTAL THYROIDECTOMY  05/02/2011    There were no vitals filed for this visit.   Subjective Assessment - 12/08/20 1055     Subjective MD is pleased with progress so far.    Limitations Standing;Walking    How long can you sit comfortably? A few hours but some start-up stiffness    How long can you stand comfortably? 15 minutes    How long can you walk comfortably? 10-15 minutes    Patient  Stated Goals improve pain and mobility, sleep better    Currently in Pain? No/denies                               [facility] Adult PT Treatment/Exercise - 12/08/20 1100       Knee/Hip Exercises: Aerobic   Recumbent Bike seat 6, L2 x 8 min      Knee/Hip Exercises: Machines for Strengthening   Total Gym Leg Press 87# 3x10 bil push; then RLE only 43# 3x10      Knee/Hip Exercises: Standing   Lateral Step Up Right;3 sets;10 reps;Hand Hold: 2;Step Height: 4"    Lateral Step Up Limitations needed UE support for eccentric control    Forward Step Up Right;3 sets;10 reps;Hand Hold: 2;Step Height: 4"    Forward Step Up Limitations cues for quad activation and to decrease compensatory patterns      Knee/Hip Exercises: Seated   Long Arc Quad Right;3 sets;10 reps    Long Arc Quad Weight 5 lbs.    Long Arc Quad Limitations LLE assist to get to full extension    Other Seated Knee/Hip Exercises seated tailgate stretch with self OP 20x 10s  PT Short Term Goals - 11/26/20 1748       PT SHORT TERM GOAL #1   Title Independent with initial HEP    Time 4    Period Weeks    Status Achieved    Target Date 11/18/20      PT SHORT TERM GOAL #2   Title Rt knee AROM improved 0-90 for improved function    Time 4    Period Weeks    Status Achieved    Target Date 11/18/20               PT Long Term Goals - 11/26/20 1748       PT LONG TERM GOAL #1   Title Independent with final HEP    Time 8    Period Weeks    Status On-going      PT LONG TERM GOAL #2   Title Rt knee AROM improved 0-110 for improved function    Time 8    Period Weeks    Status On-going      PT LONG TERM GOAL #3   Title Amb independently without significant deviations for improved function    Time 8    Period Weeks    Status On-going      PT LONG TERM GOAL #4   Title Report pain < 4/10 for improved function    Time 8    Period Weeks    Status On-going      PT  LONG TERM GOAL #5   Title FOTO score improved to 66 for improved function    Time 8    Period Weeks    Status On-going                   Plan - 12/08/20 1153     Clinical Impression Statement Pt tolerated session well today with continued focus on quad strengthening.  Still has difficulty with TKE and quad activation.  Will continue to benefit from PT to maximize function.    Personal Factors and Comorbidities Comorbidity 1    Comorbidities HTN    Examination-Activity Limitations Bed Mobility;Sleep;Squat;Stairs;Stand;Dressing;Hygiene/Grooming;Transfers;Locomotion Level    Examination-Participation Restrictions Community Activity;Driving    Stability/Clinical Decision Making Evolving/Moderate complexity    Rehab Potential Good    PT Frequency 2x / week    PT Duration 8 weeks    PT Treatment/Interventions ADLs/Self Care Home Management;Cryotherapy;Electrical Stimulation;Moist Heat;Balance training;Therapeutic exercise;Therapeutic activities;Functional mobility training;Stair training;Gait training;DME Instruction;Neuromuscular re-education;Patient/family education;Manual techniques;Vasopneumatic Device;Taping;Dry needling;Passive range of motion    PT Next Visit Plan Quadriceps strength, practical work with stairs, proprioception and balance, continue to work on step ups    PT Home Exercise Plan Access Code: [email or web address]    Consulted and Agree with Plan of Care Patient             Patient will benefit from skilled therapeutic intervention in order to improve the following deficits and impairments:  Abnormal gait, Increased edema, Decreased knowledge of use of DME, Decreased strength, Pain, Increased muscle spasms, Difficulty walking, Decreased mobility, Decreased balance, Decreased range of motion  Visit Diagnosis: Difficulty walking  Muscle weakness (generalized)  Stiffness of right knee, not elsewhere classified  Localized edema  Acute pain of right knee  Other  abnormalities of gait and mobility     Problem List Patient Active Problem List   Diagnosis Date Noted   Status post total right knee replacement 10/08/2020   Unilateral primary osteoarthritis, right knee 02/13/2018   Status post arthroscopy of  right knee 02/13/2018   Old complex tear of lateral meniscus of right knee 08/08/2017   Chronic pain of right knee 03/05/2017      [name], PT, DPT 12/08/20 11:55 AM    [facility] Physical Therapy [address] [place], [place], [address] Phone: [phone]   Fax:  [phone]  Name: Nancy Lee MRN: [id number] Date of Birth: Jun 07, 1956

## 2020-12-08 NOTE — Progress Notes (Signed)
The patient is 8-1/2 weeks now status post a right total knee arthroplasty.  She does have some stiffness and she reports burning in the knee at night but overall is making progress through therapy.  She is ambulating with a cane.  She is an active 65 year old female.  I can almost fully extend her right operative knee.  There is swelling to be expected.  Incisions healed nicely.  The knee feels ligamentously stable.  She has excellent flexion.  She will continue therapy and continue to increase her activities as comfort allows.  We will try some Neurontin at night for the burning pain.  The next time I need to see her is 4 months from now when we will get a repeat AP and lateral of the right knee.  If there is issues before then she knows to let us know.

## 2020-12-10 ENCOUNTER — Ambulatory Visit (INDEPENDENT_AMBULATORY_CARE_PROVIDER_SITE_OTHER): Payer: Medicare Other | Admitting: Physical Therapy

## 2020-12-10 ENCOUNTER — Encounter: Payer: Self-pay | Admitting: Physical Therapy

## 2020-12-10 ENCOUNTER — Other Ambulatory Visit: Payer: Self-pay

## 2020-12-10 DIAGNOSIS — R2689 Other abnormalities of gait and mobility: Secondary | ICD-10-CM

## 2020-12-10 DIAGNOSIS — M25661 Stiffness of right knee, not elsewhere classified: Secondary | ICD-10-CM | POA: Diagnosis not present

## 2020-12-10 DIAGNOSIS — R600 Localized edema: Secondary | ICD-10-CM

## 2020-12-10 DIAGNOSIS — M6281 Muscle weakness (generalized): Secondary | ICD-10-CM | POA: Diagnosis not present

## 2020-12-10 DIAGNOSIS — M25561 Pain in right knee: Secondary | ICD-10-CM

## 2020-12-10 DIAGNOSIS — R262 Difficulty in walking, not elsewhere classified: Secondary | ICD-10-CM | POA: Diagnosis not present

## 2020-12-10 NOTE — Therapy (Signed)
[facility] Physical Therapy [address] [place], [place], [address] Phone: [phone]   Fax:  [phone]  Physical Therapy Treatment  Patient Details  Name: Nancy Lee MRN: [id number] Date of Birth: 12-Sep-1955 Referring Provider (PT): Mcarthur Rossetti, MD   Encounter Date: 12/10/2020   PT End of Session - 12/10/20 1055     Visit Number 14    Number of Visits 16    Date for PT Re-Evaluation 12/16/20    PT Start Time 6546    PT Stop Time 1132    PT Time Calculation (min) 41 min    Activity Tolerance Patient tolerated treatment well;Patient limited by fatigue    Behavior During Therapy [facility] for tasks assessed/performed             Past Medical History:  Diagnosis Date   Allergy    Arthritis    GERD (gastroesophageal reflux disease)    History of kidney stones    Hyperlipidemia    Hypertension    Hypothyroidism     Past Surgical History:  Procedure Laterality Date   ABDOMINAL HYSTERECTOMY  2000   fibroids   BUNIONECTOMY     CHOLECYSTECTOMY  2007   gallstones   DILATION AND CURETTAGE OF UTERUS     DILATION AND CURETTE     THYROIDECTOMY  05/02/2011   Procedure: THYROIDECTOMY;  Surgeon: Joyice Faster. Cornett, MD;  Location: [place];  Service: General;  Laterality: Bilateral;   TONSILLECTOMY     as a child   TONSILLECTOMY AND ADENOIDECTOMY     TOTAL KNEE ARTHROPLASTY Right 10/08/2020   Procedure: RIGHT TOTAL KNEE ARTHROPLASTY;  Surgeon: Mcarthur Rossetti, MD;  [facility];  Service: Orthopedics;  Laterality: Right;  Needs RNFA   TOTAL THYROIDECTOMY  05/02/2011    There were no vitals filed for this visit.   Subjective Assessment - 12/10/20 1055     Subjective pain is gone, still has some soreness and swelling which is expected.  working on stairs at home    Limitations Standing;Walking    How long can you sit comfortably? A few hours but some start-up stiffness    How long can you stand comfortably? 15 minutes    How  long can you walk comfortably? 10-15 minutes    Patient Stated Goals improve pain and mobility, sleep better    Currently in Pain? No/denies                               [facility] Adult PT Treatment/Exercise - 12/10/20 1056       Knee/Hip Exercises: Aerobic   Nustep L7 x 10 min - flexion focus      Knee/Hip Exercises: Machines for Strengthening   Total Gym Leg Press 50# 3x10 RLE only      Knee/Hip Exercises: Standing   Forward Step Up Right;3 sets;10 reps;Hand Hold: 2;Step Height: 4"    Forward Step Up Limitations cues for quad activation and to decrease compensatory patterns    Step Down Limitations attempted lateral - unable due to limited quad activation 0-30 deg    Functional Squat 3 sets;10 reps   TRX   Other Standing Knee Exercises step up and over with RLE on 4" block 3x10 with UE support needed                      PT Short Term Goals - 11/26/20 5035  PT SHORT TERM GOAL #1   Title Independent with initial HEP    Time 4    Period Weeks    Status Achieved    Target Date 11/18/20      PT SHORT TERM GOAL #2   Title Rt knee AROM improved 0-90 for improved function    Time 4    Period Weeks    Status Achieved    Target Date 11/18/20               PT Long Term Goals - 11/26/20 1748       PT LONG TERM GOAL #1   Title Independent with final HEP    Time 8    Period Weeks    Status On-going      PT LONG TERM GOAL #2   Title Rt knee AROM improved 0-110 for improved function    Time 8    Period Weeks    Status On-going      PT LONG TERM GOAL #3   Title Amb independently without significant deviations for improved function    Time 8    Period Weeks    Status On-going      PT LONG TERM GOAL #4   Title Report pain < 4/10 for improved function    Time 8    Period Weeks    Status On-going      PT LONG TERM GOAL #5   Title FOTO score improved to 66 for improved function    Time 8    Period Weeks    Status On-going                    Plan - 12/10/20 1133     Clinical Impression Statement Continued focus on strengthening of RLE performed today.  Will need recert or d/c next week.    Personal Factors and Comorbidities Comorbidity 1    Comorbidities HTN    Examination-Activity Limitations Bed Mobility;Sleep;Squat;Stairs;Stand;Dressing;Hygiene/Grooming;Transfers;Locomotion Level    Examination-Participation Restrictions Community Activity;Driving    Stability/Clinical Decision Making Evolving/Moderate complexity    Rehab Potential Good    PT Frequency 2x / week    PT Duration 8 weeks    PT Treatment/Interventions ADLs/Self Care Home Management;Cryotherapy;Electrical Stimulation;Moist Heat;Balance training;Therapeutic exercise;Therapeutic activities;Functional mobility training;Stair training;Gait training;DME Instruction;Neuromuscular re-education;Patient/family education;Manual techniques;Vasopneumatic Device;Taping;Dry needling;Passive range of motion    PT Next Visit Plan recert v. d/c    PT Home Exercise Plan Access Code: [email or web address]    Consulted and Agree with Plan of Care Patient             Patient will benefit from skilled therapeutic intervention in order to improve the following deficits and impairments:  Abnormal gait, Increased edema, Decreased knowledge of use of DME, Decreased strength, Pain, Increased muscle spasms, Difficulty walking, Decreased mobility, Decreased balance, Decreased range of motion  Visit Diagnosis: Difficulty walking  Muscle weakness (generalized)  Stiffness of right knee, not elsewhere classified  Localized edema  Acute pain of right knee  Other abnormalities of gait and mobility     Problem List Patient Active Problem List   Diagnosis Date Noted   Status post total right knee replacement 10/08/2020   Unilateral primary osteoarthritis, right knee 02/13/2018   Status post arthroscopy of right knee 02/13/2018   Old complex tear of lateral  meniscus of right knee 08/08/2017   Chronic pain of right knee 03/05/2017     [name], PT, DPT 12/10/20 11:34 AM  [facility] Physical Therapy [address] [place], [place], [address] Phone: [phone]   Fax:  [phone]  Name: Nancy Lee MRN: [id number] Date of Birth: 08-Jan-1956

## 2020-12-15 ENCOUNTER — Other Ambulatory Visit: Payer: Self-pay

## 2020-12-15 ENCOUNTER — Encounter: Payer: Self-pay | Admitting: Physical Therapy

## 2020-12-15 ENCOUNTER — Ambulatory Visit (INDEPENDENT_AMBULATORY_CARE_PROVIDER_SITE_OTHER): Payer: Medicare Other | Admitting: Physical Therapy

## 2020-12-15 DIAGNOSIS — R600 Localized edema: Secondary | ICD-10-CM | POA: Diagnosis not present

## 2020-12-15 DIAGNOSIS — R2689 Other abnormalities of gait and mobility: Secondary | ICD-10-CM

## 2020-12-15 DIAGNOSIS — M25661 Stiffness of right knee, not elsewhere classified: Secondary | ICD-10-CM

## 2020-12-15 DIAGNOSIS — R262 Difficulty in walking, not elsewhere classified: Secondary | ICD-10-CM

## 2020-12-15 DIAGNOSIS — M6281 Muscle weakness (generalized): Secondary | ICD-10-CM

## 2020-12-15 DIAGNOSIS — M25561 Pain in right knee: Secondary | ICD-10-CM

## 2020-12-15 NOTE — Therapy (Signed)
[facility] [address] [place], [place], [address] Phone: [phone]   Fax:  [phone]  Physical Therapy Treatment  Patient Details  Name: Nancy Lee MRN: [id number] Date of Birth: Sep 03, 1955 Referring Provider (PT): Mcarthur Rossetti, MD   Encounter Date: 12/15/2020   PT End of Session - 12/15/20 1102     Visit Number 15    Number of Visits 16    Date for PT Re-Evaluation 12/16/20    PT Start Time 1010    PT Stop Time 1055    PT Time Calculation (min) 45 min    Activity Tolerance Patient tolerated treatment well;Patient limited by fatigue    Behavior During Therapy [facility] for tasks assessed/performed             Past Medical History:  Diagnosis Date   Allergy    Arthritis    GERD (gastroesophageal reflux disease)    History of kidney stones    Hyperlipidemia    Hypertension    Hypothyroidism     Past Surgical History:  Procedure Laterality Date   ABDOMINAL HYSTERECTOMY  2000   fibroids   BUNIONECTOMY     CHOLECYSTECTOMY  2007   gallstones   DILATION AND CURETTAGE OF UTERUS     DILATION AND CURETTE     THYROIDECTOMY  05/02/2011   Procedure: THYROIDECTOMY;  Surgeon: Joyice Faster. Cornett, MD;  Location: [place];  Service: General;  Laterality: Bilateral;   TONSILLECTOMY     as a child   TONSILLECTOMY AND ADENOIDECTOMY     TOTAL KNEE ARTHROPLASTY Right 10/08/2020   Procedure: RIGHT TOTAL KNEE ARTHROPLASTY;  Surgeon: Mcarthur Rossetti, MD;  Location: [facility];  Service: Orthopedics;  Laterality: Right;  Needs RNFA   TOTAL THYROIDECTOMY  05/02/2011    There were no vitals filed for this visit.   Subjective Assessment - 12/15/20 1013     Subjective doing well; knee is stiff    Limitations Standing;Walking    How long can you sit comfortably? A few hours but some start-up stiffness    How long can you stand comfortably? 15 minutes    How long can you walk comfortably? 10-15 minutes    Patient Stated Goals  improve pain and mobility, sleep better    Currently in Pain? No/denies                               [facility] Adult PT Treatment/Exercise - 12/15/20 1014       Knee/Hip Exercises: Aerobic   Recumbent Bike L4 x 8 min      Knee/Hip Exercises: Machines for Strengthening   Total Gym Leg Press 50# 3x15 RLE only      Knee/Hip Exercises: Standing   SLS 5x15 sec RLE; bil UE support      Knee/Hip Exercises: Seated   Long Arc Quad Right;3 sets;10 reps    Long Arc Quad Weight 5 lbs.    Long Arc Quad Limitations 10 sec hold    Sit to [facility] reps;without UE support;3 sets      Manual Therapy   Manual Therapy Passive ROM    Passive ROM seated knee extension with visual and tactile cues to increase quad activation - improved with multiple reps but still lacking active TKE at this time                      PT Short Term  Goals - 11/26/20 1748       PT SHORT TERM GOAL #1   Title Independent with initial HEP    Time 4    Period Weeks    Status Achieved    Target Date 11/18/20      PT SHORT TERM GOAL #2   Title Rt knee AROM improved 0-90 for improved function    Time 4    Period Weeks    Status Achieved    Target Date 11/18/20               PT Long Term Goals - 11/26/20 1748       PT LONG TERM GOAL #1   Title Independent with final HEP    Time 8    Period Weeks    Status On-going      PT LONG TERM GOAL #2   Title Rt knee AROM improved 0-110 for improved function    Time 8    Period Weeks    Status On-going      PT LONG TERM GOAL #3   Title Amb independently without significant deviations for improved function    Time 8    Period Weeks    Status On-going      PT LONG TERM GOAL #4   Title Report pain < 4/10 for improved function    Time 8    Period Weeks    Status On-going      PT LONG TERM GOAL #5   Title FOTO score improved to 66 for improved function    Time 8    Period Weeks    Status On-going                    Plan - 12/15/20 1103     Clinical Impression Statement Pt tolerated session well today with continued focus on quad strengthening.  Visable quad activation noted today during TKE exercises, but still not able to maintain against gravity.  Will continue to benefit from PT to maximize function.    Personal Factors and Comorbidities Comorbidity 1    Comorbidities HTN    Examination-Activity Limitations Bed Mobility;Sleep;Squat;Stairs;Stand;Dressing;Hygiene/Grooming;Transfers;Locomotion Level    Examination-Participation Restrictions Community Activity;Driving    Stability/Clinical Decision Making Evolving/Moderate complexity    Rehab Potential Good    PT Frequency 2x / week    PT Duration 8 weeks    PT Treatment/Interventions ADLs/Self Care Home Management;Cryotherapy;Electrical Stimulation;Moist Heat;Balance training;Therapeutic exercise;Therapeutic activities;Functional mobility training;Stair training;Gait training;DME Instruction;Neuromuscular re-education;Patient/family education;Manual techniques;Vasopneumatic Device;Taping;Dry needling;Passive range of motion    PT Next Visit Plan needs recert - check goals, dynamometry    PT Home Exercise Plan Access Code: [email or web address]    Consulted and Agree with Plan of Care Patient             Patient will benefit from skilled therapeutic intervention in order to improve the following deficits and impairments:  Abnormal gait, Increased edema, Decreased knowledge of use of DME, Decreased strength, Pain, Increased muscle spasms, Difficulty walking, Decreased mobility, Decreased balance, Decreased range of motion  Visit Diagnosis: Difficulty walking  Muscle weakness (generalized)  Stiffness of right knee, not elsewhere classified  Localized edema  Acute pain of right knee  Other abnormalities of gait and mobility     Problem List Patient Active Problem List   Diagnosis Date Noted   Status post total right knee replacement  10/08/2020   Unilateral primary osteoarthritis, right knee 02/13/2018   Status post arthroscopy of right knee 02/13/2018  Old complex tear of lateral meniscus of right knee 08/08/2017   Chronic pain of right knee 03/05/2017      [name], PT, DPT 12/15/20 11:05 AM     [facility] Therapy [address] [place], [place], [address] Phone: [phone]   Fax:  [phone]  Name: KONNIE NOFFSINGER MRN: [id number] Date of Birth: 22-Oct-1955

## 2020-12-17 ENCOUNTER — Encounter: Payer: Self-pay | Admitting: Physical Therapy

## 2020-12-17 ENCOUNTER — Ambulatory Visit (INDEPENDENT_AMBULATORY_CARE_PROVIDER_SITE_OTHER): Payer: Medicare Other | Admitting: Physical Therapy

## 2020-12-17 ENCOUNTER — Other Ambulatory Visit: Payer: Self-pay

## 2020-12-17 DIAGNOSIS — R262 Difficulty in walking, not elsewhere classified: Secondary | ICD-10-CM | POA: Diagnosis not present

## 2020-12-17 DIAGNOSIS — R600 Localized edema: Secondary | ICD-10-CM

## 2020-12-17 DIAGNOSIS — M25561 Pain in right knee: Secondary | ICD-10-CM

## 2020-12-17 DIAGNOSIS — M6281 Muscle weakness (generalized): Secondary | ICD-10-CM

## 2020-12-17 DIAGNOSIS — M25661 Stiffness of right knee, not elsewhere classified: Secondary | ICD-10-CM

## 2020-12-17 DIAGNOSIS — R2689 Other abnormalities of gait and mobility: Secondary | ICD-10-CM

## 2020-12-17 NOTE — Therapy (Addendum)
[facility] Physical Therapy [address] [place], [place], [address] Phone: [phone]   Fax:  [phone]  Physical Therapy Treatment/Recertification   Patient Details  Name: Nancy Lee MRN: [id number] Date of Birth: 1955/10/06 Referring Provider (PT): Mcarthur Rossetti, MD   Encounter Date: 12/17/2020    12/17/20 0901  PT Visits / Re-Eval  Visit Number 16  Number of Visits 24  Date for PT Re-Evaluation 01/14/21  PT Time Calculation  PT Start Time 7253  PT Stop Time 0923  PT Time Calculation (min) 39 min  PT - End of Session  Activity Tolerance Patient tolerated treatment well;Patient limited by fatigue  Behavior During Therapy [facility] for tasks assessed/performed      Past Medical History:  Diagnosis Date   Allergy    Arthritis    GERD (gastroesophageal reflux disease)    History of kidney stones    Hyperlipidemia    Hypertension    Hypothyroidism     Past Surgical History:  Procedure Laterality Date   ABDOMINAL HYSTERECTOMY  2000   fibroids   BUNIONECTOMY     CHOLECYSTECTOMY  2007   gallstones   DILATION AND CURETTAGE OF UTERUS     DILATION AND CURETTE     THYROIDECTOMY  05/02/2011   Procedure: THYROIDECTOMY;  Surgeon: Joyice Faster. Cornett, MD;  Location: [place];  Service: General;  Laterality: Bilateral;   TONSILLECTOMY     as a child   TONSILLECTOMY AND ADENOIDECTOMY     TOTAL KNEE ARTHROPLASTY Right 10/08/2020   Procedure: RIGHT TOTAL KNEE ARTHROPLASTY;  Surgeon: Mcarthur Rossetti, MD;  Location: [facility];  Service: Orthopedics;  Laterality: Right;  Needs RNFA   TOTAL THYROIDECTOMY  05/02/2011    There were no vitals filed for this visit.   Subjective Assessment - 12/17/20 0846     Subjective knee doing well, tried to walk without cane and knee wants to buckle    Limitations Standing;Walking    How long can you sit comfortably? A few hours but some start-up stiffness    How long can you stand comfortably? 15 minutes     How long can you walk comfortably? 10-15 minutes    Patient Stated Goals improve pain and mobility, sleep better    Currently in Pain? No/denies                   12/17/20 6644  Assessment  Medical Diagnosis M17.11 (ICD-10-CM) - Unilateral primary osteoarthritis, right knee  Referring Provider (PT) Mcarthur Rossetti, MD  Onset Date/Surgical Date 10/08/20  Observation/Other Assessments  Focus on Therapeutic Outcomes (FOTO)  64  AROM  Right Knee Extension -1  Right Knee Flexion 106                [facility] Adult PT Treatment/Exercise - 12/17/20 0001       Knee/Hip Exercises: Aerobic   Recumbent Bike Seat 6 for 8 minutes; L4      Knee/Hip Exercises: Machines for Strengthening   Total Gym Leg Press 75# and 100# 1 sets of 15 with slow eccentrics; RLE 50# 2x15              12/17/20 0910  Knee/Hip Exercises: Standing  SLS 5x15 sec RLE; bil UE support  Terminal Knee Extension Right;3 sets;10 reps;Theraband  Theraband Level (Terminal Knee Extension) Level 4 (Blue)  Terminal Knee Extension Limitations 10 sec hold  Knee/Hip Exercises: Seated  Long Arc Quad Right;3 sets;10 reps  Long Arc Quad Weight 5 lbs.  Long Arc Quad Limitations 10 sec hold            PT Short Term Goals - 11/26/20 1748       PT SHORT TERM GOAL #1   Title Independent with initial HEP    Time 4    Period Weeks    Status Achieved    Target Date 11/18/20      PT SHORT TERM GOAL #2   Title Rt knee AROM improved 0-90 for improved function    Time 4    Period Weeks    Status Achieved    Target Date 11/18/20              12/17/20 0923  PT LONG TERM GOAL #1  Title Independent with final HEP  Time 4  Period Weeks  Status On-going  Target Date 01/14/21  PT LONG TERM GOAL #2  Title Rt knee AROM improved 0-110 for improved function  Time 4  Period Weeks  Status On-going  Target Date 01/14/21  PT LONG TERM GOAL #3  Title Amb independently without significant  deviations for improved function  Time 4  Period Weeks  Status On-going  Target Date 01/14/21  PT LONG TERM GOAL #4  Title Report pain < 4/10 for improved function  Time 8  Period Weeks  Status Achieved  PT LONG TERM GOAL #5  Title FOTO score improved to 66 for improved function  Time 4  Period Weeks  Status On-going  Target Date 01/14/21      12/17/20 2197  Plan  Clinical Impression Statement Pt has met 1 LTG to date and has demonstrated progress towards all other LTGs, just not quite met yet.  Recommend continued PT 2x/wk x 4 more weeks to maximize functional mobility.  Quad strength and activation continues to be limiting factor at this time.  Personal Factors and Comorbidities Comorbidity 1  Comorbidities HTN  Examination-Activity Limitations Bed Mobility;Sleep;Squat;Stairs;Stand;Dressing;Hygiene/Grooming;Transfers;Locomotion Level  Examination-Participation Restrictions Community Activity;Driving  Pt will benefit from skilled therapeutic intervention in order to improve on the following deficits Abnormal gait;Increased edema;Decreased knowledge of use of DME;Decreased strength;Pain;Increased muscle spasms;Difficulty walking;Decreased mobility;Decreased balance;Decreased range of motion  Stability/Clinical Decision Making Evolving/Moderate complexity  Rehab Potential Good  PT Frequency 2x / week  PT Duration 8 weeks  PT Treatment/Interventions ADLs/Self Care Home Management;Cryotherapy;Electrical Stimulation;Moist Heat;Balance training;Therapeutic exercise;Therapeutic activities;Functional mobility training;Stair training;Gait training;DME Instruction;Neuromuscular re-education;Patient/family education;Manual techniques;Vasopneumatic Device;Taping;Dry needling;Passive range of motion  PT Next Visit Plan continue with quad strengthening, maximize ROM  PT Home Exercise Plan Access Code: [email or web address]  Consulted and Agree with Plan of Care Patient      Patient will benefit from  skilled therapeutic intervention in order to improve the following deficits and impairments:  Abnormal gait;Increased edema;Decreased knowledge of use of DME;Decreased strength;Pain;Increased muscle spasms;Difficulty walking;Decreased mobility;Decreased balance;Decreased range of motion  Visit Diagnosis:  1.   Difficulty walking  R26.2       2.   Muscle weakness (generalized)  M62.81       3.   Stiffness of right knee, not elsewhere classified  M25.661       4.   Localized edema  R60.0       5.   Acute pain of right knee  M25.561       6.   Other abnormalities of gait and mobility  R26.89            Problem List Patient Active Problem List   Diagnosis Date Noted   Status post  total right knee replacement 10/08/2020   Unilateral primary osteoarthritis, right knee 02/13/2018   Status post arthroscopy of right knee 02/13/2018   Old complex tear of lateral meniscus of right knee 08/08/2017   Chronic pain of right knee 03/05/2017      [name], PT, DPT 12/22/20 9:32 AM     [facility] Physical Therapy [address] [place], [place], [address] Phone: [phone]   Fax:  [phone]  Name: Nancy Lee MRN: [id number] Date of Birth: [birth year]

## 2020-12-22 ENCOUNTER — Ambulatory Visit (INDEPENDENT_AMBULATORY_CARE_PROVIDER_SITE_OTHER): Payer: Medicare Other | Admitting: Physical Therapy

## 2020-12-22 ENCOUNTER — Encounter: Payer: Self-pay | Admitting: Physical Therapy

## 2020-12-22 ENCOUNTER — Other Ambulatory Visit: Payer: Self-pay

## 2020-12-22 DIAGNOSIS — R262 Difficulty in walking, not elsewhere classified: Secondary | ICD-10-CM

## 2020-12-22 DIAGNOSIS — R600 Localized edema: Secondary | ICD-10-CM | POA: Diagnosis not present

## 2020-12-22 DIAGNOSIS — M25561 Pain in right knee: Secondary | ICD-10-CM

## 2020-12-22 DIAGNOSIS — M25661 Stiffness of right knee, not elsewhere classified: Secondary | ICD-10-CM | POA: Diagnosis not present

## 2020-12-22 DIAGNOSIS — R2689 Other abnormalities of gait and mobility: Secondary | ICD-10-CM

## 2020-12-22 DIAGNOSIS — M6281 Muscle weakness (generalized): Secondary | ICD-10-CM | POA: Diagnosis not present

## 2020-12-22 NOTE — Therapy (Signed)
[facility] Physical Therapy [address] [place], [place], [address] Phone: [phone]   Fax:  [phone]  Physical Therapy Treatment  Patient Details  Name: Nancy Lee MRN: [id number] Date of Birth: [birth year] Referring Provider (PT): Mcarthur Rossetti, MD   Encounter Date: 12/22/2020   PT End of Session - 12/22/20 0851     Visit Number 17    Number of Visits 24    Date for PT Re-Evaluation 01/14/21    PT Start Time 0843    PT Stop Time 0924    PT Time Calculation (min) 41 min    Activity Tolerance Patient tolerated treatment well;Patient limited by fatigue    Behavior During Therapy [facility] for tasks assessed/performed             Past Medical History:  Diagnosis Date   Allergy    Arthritis    GERD (gastroesophageal reflux disease)    History of kidney stones    Hyperlipidemia    Hypertension    Hypothyroidism     Past Surgical History:  Procedure Laterality Date   ABDOMINAL HYSTERECTOMY  2000   fibroids   BUNIONECTOMY     CHOLECYSTECTOMY  2007   gallstones   DILATION AND CURETTAGE OF UTERUS     DILATION AND CURETTE     THYROIDECTOMY  05/02/2011   Procedure: THYROIDECTOMY;  Surgeon: Joyice Faster. Cornett, MD;  Location: [place];  Service: General;  Laterality: Bilateral;   TONSILLECTOMY     as a child   TONSILLECTOMY AND ADENOIDECTOMY     TOTAL KNEE ARTHROPLASTY Right 10/08/2020   Procedure: RIGHT TOTAL KNEE ARTHROPLASTY;  Surgeon: Mcarthur Rossetti, MD;  Location: [facility];  Service: Orthopedics;  Laterality: Right;  Needs RNFA   TOTAL THYROIDECTOMY  05/02/2011    There were no vitals filed for this visit.   Subjective Assessment - 12/22/20 0850     Subjective Pt arriving reporting no pain only soreness at times. Pt reporting she has been walking at home without her cane and only uses it for community ambulation and distances.    Limitations Standing;Walking    Patient Stated Goals improve pain and mobility, sleep better     Currently in Pain? No/denies                               [facility] Adult PT Treatment/Exercise - 12/22/20 0001       Ambulation/Gait   Gait Comments Pt instructed in heel to toe gait with increased hip and knee flexion on the right LE with no device.      Knee/Hip Exercises: Aerobic   Recumbent Bike L4 seat 6 x 8 minutes      Knee/Hip Exercises: Machines for Strengthening   Total Gym Leg Press 100# bilateral LE's 2x15, 50# rt LE x15      Knee/Hip Exercises: Standing   Terminal Knee Extension Right;3 sets;10 reps;Theraband    Theraband Level (Terminal Knee Extension) Level 4 (Blue)    Terminal Knee Extension Limitations 10 sec hold    Forward Step Up Both;15 reps;Hand Hold: 1;Step Height: 6"    Forward Step Up Limitations more difficulty leading with right LE    SLS 5x 15 seconds with single UE support      Knee/Hip Exercises: Seated   Long Arc Quad Right;3 sets;10 reps    Long Arc Quad Weight 5 lbs.    Long [employer] Limitations 10 sec hold  Sit to Sand 10 reps;without UE support;2 sets                      PT Short Term Goals - 12/22/20 0854       PT SHORT TERM GOAL #1   Title Independent with initial HEP    Status Achieved      PT SHORT TERM GOAL #2   Status Achieved               PT Long Term Goals - 12/22/20 0856       PT LONG TERM GOAL #1   Title Independent with final HEP    Status On-going      PT LONG TERM GOAL #2   Title Rt knee AROM improved 0-110 for improved function    Status On-going      PT LONG TERM GOAL #3   Title Amb independently without significant deviations for improved function    Status On-going      PT LONG TERM GOAL #4   Title Report pain < 4/10 for improved function    Status Achieved      PT LONG TERM GOAL #5   Title FOTO score improved to 66 for improved function    Status On-going                   Plan - 12/22/20 5956     Clinical Impression Statement Pt arriving today  reporting no pain. We worked on heel to toe gait pattern on level surfaces without device. Pt still focusing on quad strength and more independent functional mobility without the use of UE support. Continue skilled PT to maximize function toward goals set.    Personal Factors and Comorbidities Comorbidity 1    Comorbidities HTN    Examination-Activity Limitations Bed Mobility;Sleep;Squat;Stairs;Stand;Dressing;Hygiene/Grooming;Transfers;Locomotion Level    Examination-Participation Restrictions Community Activity;Driving    Stability/Clinical Decision Making Evolving/Moderate complexity    Rehab Potential Good    PT Frequency 2x / week    PT Duration 8 weeks    PT Treatment/Interventions ADLs/Self Care Home Management;Cryotherapy;Electrical Stimulation;Moist Heat;Balance training;Therapeutic exercise;Therapeutic activities;Functional mobility training;Stair training;Gait training;DME Instruction;Neuromuscular re-education;Patient/family education;Manual techniques;Vasopneumatic Device;Taping;Dry needling;Passive range of motion    PT Next Visit Plan continue with quad strengthening, maximize ROM    PT Home Exercise Plan Access Code: [email or web address]    Consulted and Agree with Plan of Care Patient             Patient will benefit from skilled therapeutic intervention in order to improve the following deficits and impairments:  Abnormal gait, Increased edema, Decreased knowledge of use of DME, Decreased strength, Pain, Increased muscle spasms, Difficulty walking, Decreased mobility, Decreased balance, Decreased range of motion  Visit Diagnosis: Difficulty walking  Muscle weakness (generalized)  Stiffness of right knee, not elsewhere classified  Localized edema  Acute pain of right knee  Other abnormalities of gait and mobility     Problem List Patient Active Problem List   Diagnosis Date Noted   Status post total right knee replacement 10/08/2020   Unilateral primary osteoarthritis,  right knee 02/13/2018   Status post arthroscopy of right knee 02/13/2018   Old complex tear of lateral meniscus of right knee 08/08/2017   Chronic pain of right knee 03/05/2017    Oretha Caprice, PT, MPT 12/22/2020, 9:20 AM  [facility] Physical Therapy [address] [place], [place], [address] Phone: [phone]   Fax:  [phone]  Name: REGINA GANCI MRN:  [id number] Date of Birth: 03-05-1956

## 2020-12-24 ENCOUNTER — Other Ambulatory Visit: Payer: Self-pay

## 2020-12-24 ENCOUNTER — Encounter: Payer: Self-pay | Admitting: Physical Therapy

## 2020-12-24 ENCOUNTER — Ambulatory Visit (INDEPENDENT_AMBULATORY_CARE_PROVIDER_SITE_OTHER): Payer: Medicare Other | Admitting: Physical Therapy

## 2020-12-24 DIAGNOSIS — R600 Localized edema: Secondary | ICD-10-CM | POA: Diagnosis not present

## 2020-12-24 DIAGNOSIS — R2689 Other abnormalities of gait and mobility: Secondary | ICD-10-CM

## 2020-12-24 DIAGNOSIS — M25661 Stiffness of right knee, not elsewhere classified: Secondary | ICD-10-CM

## 2020-12-24 DIAGNOSIS — M6281 Muscle weakness (generalized): Secondary | ICD-10-CM

## 2020-12-24 DIAGNOSIS — M25561 Pain in right knee: Secondary | ICD-10-CM

## 2020-12-24 DIAGNOSIS — R262 Difficulty in walking, not elsewhere classified: Secondary | ICD-10-CM

## 2020-12-24 NOTE — Therapy (Signed)
[facility] [address] [place], [place], [address] Phone: [phone]   Fax:  [phone]  Physical Therapy Treatment  Patient Details  Name: Nancy Lee MRN: [id number] Date of Birth: 10/04/1955 Referring Provider (PT): Mcarthur Rossetti, MD   Encounter Date: 12/24/2020   PT End of Session - 12/24/20 0920     Visit Number 18    Number of Visits 24    Date for PT Re-Evaluation 01/14/21    PT Start Time 0926    PT Stop Time 1010    PT Time Calculation (min) 44 min    Activity Tolerance Patient tolerated treatment well;Patient limited by fatigue    Behavior During Therapy [facility] for tasks assessed/performed             Past Medical History:  Diagnosis Date   Allergy    Arthritis    GERD (gastroesophageal reflux disease)    History of kidney stones    Hyperlipidemia    Hypertension    Hypothyroidism     Past Surgical History:  Procedure Laterality Date   ABDOMINAL HYSTERECTOMY  2000   fibroids   BUNIONECTOMY     CHOLECYSTECTOMY  2007   gallstones   DILATION AND CURETTAGE OF UTERUS     DILATION AND CURETTE     THYROIDECTOMY  05/02/2011   Procedure: THYROIDECTOMY;  Surgeon: Joyice Faster. Cornett, MD;  Location: [place];  Service: General;  Laterality: Bilateral;   TONSILLECTOMY     as a child   TONSILLECTOMY AND ADENOIDECTOMY     TOTAL KNEE ARTHROPLASTY Right 10/08/2020   Procedure: RIGHT TOTAL KNEE ARTHROPLASTY;  Surgeon: Mcarthur Rossetti, MD;  Location: [facility];  Service: Orthopedics;  Laterality: Right;  Needs RNFA   TOTAL THYROIDECTOMY  05/02/2011    There were no vitals filed for this visit.   Subjective Assessment - 12/24/20 0926     Subjective Pt reports she is doing well, no more knee pain, only stiffness    Patient Stated Goals improve pain and mobility, sleep better    Currently in Pain? No/denies                               [facility] Adult PT Treatment/Exercise - 12/24/20 0001        High Level Balance   High Level Balance Activities Tandem walking    High Level Balance Comments on foam balance beam with UE assist      Knee/Hip Exercises: Stretches   Passive Hamstring Stretch Right;3 reps;30 seconds   seated with FWD lean   Passive Hamstring Stretch Limitations overpressure on Rt thigh    Gastroc Stretch Both;30 seconds    Soleus Stretch Both;30 seconds      Knee/Hip Exercises: Aerobic   Recumbent Bike L4 seat 6 x 8 minutes      Knee/Hip Exercises: Standing   Forward Lunges Right;3 sets;10 reps    Forward Lunges Limitations mini lunges with Rt LE FWD, VC to shift st to Rt and press up through heel    Forward Step Up Step Height: 6";Both;10 reps    Forward Step Up Limitations very hard when leading with Rt LE    SLS with Vectors bilat sides with toe taps around clock and hip CARs      Knee/Hip Exercises: Seated   Long Arc Quad Strengthening;Right;3 sets;10 reps    Long Arc Quad Weight 5 lbs.    [place]  Quad Limitations 10 sec hold    Heel Slides AROM;Right;10 reps                      PT Short Term Goals - 12/22/20 0854       PT SHORT TERM GOAL #1   Title Independent with initial HEP    Status Achieved      PT SHORT TERM GOAL #2   Status Achieved               PT Long Term Goals - 12/22/20 0856       PT LONG TERM GOAL #1   Title Independent with final HEP    Status On-going      PT LONG TERM GOAL #2   Title Rt knee AROM improved 0-110 for improved function    Status On-going      PT LONG TERM GOAL #3   Title Amb independently without significant deviations for improved function    Status On-going      PT LONG TERM GOAL #4   Title Report pain < 4/10 for improved function    Status Achieved      PT LONG TERM GOAL #5   Title FOTO score improved to 66 for improved function    Status On-going                   Plan - 12/24/20 1008     Clinical Impression Statement Todays session was focused a lot on  balance and proprioception work as she feels this is her bigest challenge.  She did well with these as long as she had UE assist.  Would benefit from more work on balance.    Rehab Potential Good    PT Frequency 2x / week    PT Duration 8 weeks    PT Treatment/Interventions ADLs/Self Care Home Management;Cryotherapy;Electrical Stimulation;Moist Heat;Balance training;Therapeutic exercise;Therapeutic activities;Functional mobility training;Stair training;Gait training;DME Instruction;Neuromuscular re-education;Patient/family education;Manual techniques;Vasopneumatic Device;Taping;Dry needling;Passive range of motion    PT Next Visit Plan balance and high level strength    PT Home Exercise Plan Access Code: [email or web address]    Consulted and Agree with Plan of Care Patient             Patient will benefit from skilled therapeutic intervention in order to improve the following deficits and impairments:  Abnormal gait, Increased edema, Decreased knowledge of use of DME, Decreased strength, Pain, Increased muscle spasms, Difficulty walking, Decreased mobility, Decreased balance, Decreased range of motion  Visit Diagnosis: Stiffness of right knee, not elsewhere classified  Localized edema  Other abnormalities of gait and mobility  Muscle weakness (generalized)  Difficulty walking  Acute pain of right knee     Problem List Patient Active Problem List   Diagnosis Date Noted   Status post total right knee replacement 10/08/2020   Unilateral primary osteoarthritis, right knee 02/13/2018   Status post arthroscopy of right knee 02/13/2018   Old complex tear of lateral meniscus of right knee 08/08/2017   Chronic pain of right knee 03/05/2017    Jeral Pinch PT  12/24/2020, 10:11 AM  [facility] Physical Therapy [address] [place], [place], [address] Phone: [phone]   Fax:  [phone]  Name: Nancy Lee MRN: [id number] Date of Birth: 1956/04/19

## 2020-12-29 ENCOUNTER — Encounter: Payer: Self-pay | Admitting: Physical Therapy

## 2020-12-29 ENCOUNTER — Other Ambulatory Visit: Payer: Self-pay

## 2020-12-29 ENCOUNTER — Ambulatory Visit (INDEPENDENT_AMBULATORY_CARE_PROVIDER_SITE_OTHER): Payer: Medicare Other | Admitting: Physical Therapy

## 2020-12-29 DIAGNOSIS — R600 Localized edema: Secondary | ICD-10-CM | POA: Diagnosis not present

## 2020-12-29 DIAGNOSIS — M6281 Muscle weakness (generalized): Secondary | ICD-10-CM | POA: Diagnosis not present

## 2020-12-29 DIAGNOSIS — M25661 Stiffness of right knee, not elsewhere classified: Secondary | ICD-10-CM | POA: Diagnosis not present

## 2020-12-29 DIAGNOSIS — R2689 Other abnormalities of gait and mobility: Secondary | ICD-10-CM | POA: Diagnosis not present

## 2020-12-29 DIAGNOSIS — R262 Difficulty in walking, not elsewhere classified: Secondary | ICD-10-CM

## 2020-12-29 NOTE — Therapy (Signed)
[facility] Physical Therapy [address] [place], [place], [address] Phone: [phone]   Fax:  [phone]  Physical Therapy Treatment  Patient Details  Name: Nancy Lee MRN: [id number] Date of Birth: 1956-03-29 Referring Provider (PT): Mcarthur Rossetti, MD   Encounter Date: 12/29/2020   PT End of Session - 12/29/20 1244     Visit Number 19    Number of Visits 24    Date for PT Re-Evaluation 01/14/21    Progress Note Due on Visit 20    PT Start Time 1138    PT Stop Time 1218    PT Time Calculation (min) 40 min    Activity Tolerance Patient tolerated treatment well;Patient limited by fatigue    Behavior During Therapy [facility] for tasks assessed/performed             Past Medical History:  Diagnosis Date   Allergy    Arthritis    GERD (gastroesophageal reflux disease)    History of kidney stones    Hyperlipidemia    Hypertension    Hypothyroidism     Past Surgical History:  Procedure Laterality Date   ABDOMINAL HYSTERECTOMY  2000   fibroids   BUNIONECTOMY     CHOLECYSTECTOMY  2007   gallstones   DILATION AND CURETTAGE OF UTERUS     DILATION AND CURETTE     THYROIDECTOMY  05/02/2011   Procedure: THYROIDECTOMY;  Surgeon: Joyice Faster. Cornett, MD;  Location: [place];  Service: General;  Laterality: Bilateral;   TONSILLECTOMY     as a child   TONSILLECTOMY AND ADENOIDECTOMY     TOTAL KNEE ARTHROPLASTY Right 10/08/2020   Procedure: RIGHT TOTAL KNEE ARTHROPLASTY;  Surgeon: Mcarthur Rossetti, MD;  Location: [facility];  Service: Orthopedics;  Laterality: Right;  Needs RNFA   TOTAL THYROIDECTOMY  05/02/2011    There were no vitals filed for this visit.   Subjective Assessment - 12/29/20 1138     Subjective her leg was sore, but she's doing well otherwise    Patient Stated Goals improve pain and mobility, sleep better    Currently in Pain? No/denies                               [facility] Adult PT Treatment/Exercise  - 12/29/20 1142       Neuro Re-ed    Neuro Re-ed Details  tandem walking fwd/bwd on foam beam x 8 reps with intermittent UE support; side stepping on beam x 5 reps without UE support      Knee/Hip Exercises: Aerobic   Recumbent Bike L6 x 8 min      Knee/Hip Exercises: Standing   Forward Step Up Right;3 sets;10 reps;Hand Hold: 1;Step Height: 4"    Forward Step Up Limitations no LLE foot on step    SLS with Vectors bilat sides with opposite leg around clock and hip CARs, 2x10 bil      Knee/Hip Exercises: Seated   Long Arc Quad Strengthening;Right;3 sets;10 reps    Long Arc Quad Weight 5 lbs.    Long Arc Quad Limitations 10 sec hold                      PT Short Term Goals - 12/22/20 0854       PT SHORT TERM GOAL #1   Title Independent with initial HEP    Status Achieved      PT SHORT  TERM GOAL #2   Status Achieved               PT Long Term Goals - 12/22/20 0856       PT LONG TERM GOAL #1   Title Independent with final HEP    Status On-going      PT LONG TERM GOAL #2   Title Rt knee AROM improved 0-110 for improved function    Status On-going      PT LONG TERM GOAL #3   Title Amb independently without significant deviations for improved function    Status On-going      PT LONG TERM GOAL #4   Title Report pain < 4/10 for improved function    Status Achieved      PT LONG TERM GOAL #5   Title FOTO score improved to 66 for improved function    Status On-going                   Plan - 12/29/20 1244     Clinical Impression Statement Pt progressing well with PT, and able to better perform step ups onton 4" block today.  Still continues to have difficulty with quad activation with TKE.  Continued focus on balance and strengthening with PT.    Rehab Potential Good    PT Frequency 2x / week    PT Duration 8 weeks    PT Treatment/Interventions ADLs/Self Care Home Management;Cryotherapy;Electrical Stimulation;Moist Heat;Balance  training;Therapeutic exercise;Therapeutic activities;Functional mobility training;Stair training;Gait training;DME Instruction;Neuromuscular re-education;Patient/family education;Manual techniques;Vasopneumatic Device;Taping;Dry needling;Passive range of motion    PT Next Visit Plan balance and high level strength    PT Home Exercise Plan Access Code: [email or web address]    Consulted and Agree with Plan of Care Patient             Patient will benefit from skilled therapeutic intervention in order to improve the following deficits and impairments:  Abnormal gait, Increased edema, Decreased knowledge of use of DME, Decreased strength, Pain, Increased muscle spasms, Difficulty walking, Decreased mobility, Decreased balance, Decreased range of motion  Visit Diagnosis: Stiffness of right knee, not elsewhere classified  Localized edema  Other abnormalities of gait and mobility  Muscle weakness (generalized)  Difficulty walking     Problem List Patient Active Problem List   Diagnosis Date Noted   Status post total right knee replacement 10/08/2020   Unilateral primary osteoarthritis, right knee 02/13/2018   Status post arthroscopy of right knee 02/13/2018   Old complex tear of lateral meniscus of right knee 08/08/2017   Chronic pain of right knee 03/05/2017      [name], PT, DPT 12/29/20 12:48 PM     [facility] [address] [place], [place], [address] Phone: [phone]   Fax:  [phone]  Name: ERINN MENDOSA MRN: [id number] Date of Birth: 1955-08-17

## 2020-12-31 ENCOUNTER — Ambulatory Visit (INDEPENDENT_AMBULATORY_CARE_PROVIDER_SITE_OTHER): Payer: Medicare Other | Admitting: Rehabilitative and Restorative Service Providers"

## 2020-12-31 ENCOUNTER — Other Ambulatory Visit: Payer: Self-pay

## 2020-12-31 ENCOUNTER — Encounter: Payer: Self-pay | Admitting: Rehabilitative and Restorative Service Providers"

## 2020-12-31 DIAGNOSIS — M6281 Muscle weakness (generalized): Secondary | ICD-10-CM | POA: Diagnosis not present

## 2020-12-31 DIAGNOSIS — R262 Difficulty in walking, not elsewhere classified: Secondary | ICD-10-CM

## 2020-12-31 DIAGNOSIS — M25661 Stiffness of right knee, not elsewhere classified: Secondary | ICD-10-CM

## 2020-12-31 DIAGNOSIS — R600 Localized edema: Secondary | ICD-10-CM

## 2020-12-31 NOTE — Therapy (Signed)
[facility] Physical Therapy [address] [place], [place], [address] Phone: [phone]   Fax:  [phone]  Physical Therapy Treatment?Reassessment  Patient Details  Name: Nancy Lee MRN: [id number] Date of Birth: 12/17/1955 Referring Provider (PT): Mcarthur Rossetti, MD   Encounter Date: 12/31/2020   PT End of Session - 12/31/20 1101     Visit Number 20    Number of Visits 24    Date for PT Re-Evaluation 01/14/21    Progress Note Due on Visit 24    PT Start Time 1015    PT Stop Time 1108    PT Time Calculation (min) 53 min    Activity Tolerance Patient tolerated treatment well;Patient limited by fatigue    Behavior During Therapy [facility] for tasks assessed/performed             Past Medical History:  Diagnosis Date   Allergy    Arthritis    GERD (gastroesophageal reflux disease)    History of kidney stones    Hyperlipidemia    Hypertension    Hypothyroidism     Past Surgical History:  Procedure Laterality Date   ABDOMINAL HYSTERECTOMY  2000   fibroids   BUNIONECTOMY     CHOLECYSTECTOMY  2007   gallstones   DILATION AND CURETTAGE OF UTERUS     DILATION AND CURETTE     THYROIDECTOMY  05/02/2011   Procedure: THYROIDECTOMY;  Surgeon: Joyice Faster. Cornett, MD;  Location: [place];  Service: General;  Laterality: Bilateral;   TONSILLECTOMY     as a child   TONSILLECTOMY AND ADENOIDECTOMY     TOTAL KNEE ARTHROPLASTY Right 10/08/2020   Procedure: RIGHT TOTAL KNEE ARTHROPLASTY;  Surgeon: Mcarthur Rossetti, MD;  Location: [facility];  Service: Orthopedics;  Laterality: Right;  Needs RNFA   TOTAL THYROIDECTOMY  05/02/2011    There were no vitals filed for this visit.   Subjective Assessment - 12/31/20 1024     Subjective [name] is not taking pain medication.  Her knee does not bother her at night.  Start-up stiffness and stairs are her biggest concern.    Limitations Sitting;Standing;Walking    How long can you sit comfortably? 30-60  minutes before it starts to stiffen up    How long can you stand comfortably? 30-60 minutes (was 15 minutes)    How long can you walk comfortably? 25 minutes (was 10-15 minutes)    Patient Stated Goals improve pain and mobility, sleep better    Currently in Pain? No/denies    Pain Score 0-No pain    Pain Location Knee    Pain Orientation Right    Pain Descriptors / Indicators Tightness    Pain Onset More than a month ago    Pain Frequency Occasional    Aggravating Factors  Prolonged postures or overuse    Pain Relieving Factors Ice    Effect of Pain on Daily Activities Still uses a cane with gait and WB function is limited (needs a break due to fatigue).    Multiple Pain Sites No                [facility] PT Assessment - 12/31/20 0001       AROM   Right Knee Extension -2    Right Knee Flexion 108                           [facility] Adult PT Treatment/Exercise - 12/31/20 0001  Therapeutic Activites    Therapeutic Activities ADL's    ADL's Step-up and over 6 and 8 inch steps with 1 handrail and slow eccentrics and step down off 4 inch step      Neuro Re-ed    Neuro Re-ed Details  Tandem balance 3X each 20 seconds eyes open/closed/head turning eyes open      Exercises   Exercises Knee/Hip      Knee/Hip Exercises: Aerobic   Recumbent Bike Seat 6 for 8 minutes Level 3-4      Knee/Hip Exercises: Machines for Strengthening   Total Gym Leg Press 100# 2 sets of 15 full extension to stretch at flexion (slow eccentrics)      Knee/Hip Exercises: Supine   Quad Sets Strengthening;Both;1 set;10 reps;Limitations    Quad Sets Limitations 5 seconds (toes back, press knees down, tighten thighs)      Modalities   Modalities Vasopneumatic      Vasopneumatic   Number Minutes Vasopneumatic  10 minutes    Vasopnuematic Location  Knee    Vasopneumatic Pressure Medium    Vasopneumatic Temperature  34*                    PT Education - 12/31/20 1058      Education Details Focus on balance and function today in the office.  Focus on extension AROM and strength at home.    Person(s) Educated Patient    Methods Explanation;Demonstration;Tactile cues;Verbal cues;Handout    Comprehension Verbal cues required;Returned demonstration;Need further instruction;Tactile cues required;Verbalized understanding              PT Short Term Goals - 12/22/20 0854       PT SHORT TERM GOAL #1   Title Independent with initial HEP    Status Achieved      PT SHORT TERM GOAL #2   Status Achieved               PT Long Term Goals - 12/31/20 1059       PT LONG TERM GOAL #1   Title Independent with final HEP    Status On-going      PT LONG TERM GOAL #2   Title Rt knee AROM improved 0-110 for improved function    Baseline -2 to 108 on 12/31/2020    Status On-going      PT LONG TERM GOAL #3   Title Amb independently without significant deviations for improved function    Baseline Uses cane outside the home    Status On-going      PT LONG TERM GOAL #4   Title Report pain < 4/10 for improved function    Status Achieved      PT LONG TERM GOAL #5   Title FOTO score improved to 66 for improved function    Status On-going                   Plan - 12/31/20 1101     Clinical Impression Statement Raziya continues to make progress towards long-term goals.  AROM -2 to 108 degrees today. Stairs, balance and gait will be a focus the next few visits with transition into independent rehabilitation anticipated in ~ 2 weeks.    Personal Factors and Comorbidities Comorbidity 1    Comorbidities HTN    Examination-Activity Limitations Bed Mobility;Sleep;Squat;Stairs;Stand;Dressing;Hygiene/Grooming;Transfers;Locomotion Level    Examination-Participation Restrictions Community Activity;Driving    Rehab Potential Good    PT Frequency 2x / week    PT  Duration 8 weeks    PT Treatment/Interventions ADLs/Self Care Home  Management;Cryotherapy;Electrical Stimulation;Moist Heat;Balance training;Therapeutic exercise;Therapeutic activities;Functional mobility training;Stair training;Gait training;DME Instruction;Neuromuscular re-education;Patient/family education;Manual techniques;Vasopneumatic Device;Taping;Dry needling;Passive range of motion    PT Next Visit Plan Balance, gait, stairs, prepare for independent PT    PT Home Exercise Plan Access Code: [email or web address]    Consulted and Agree with Plan of Care Patient             Patient will benefit from skilled therapeutic intervention in order to improve the following deficits and impairments:  Abnormal gait, Increased edema, Decreased knowledge of use of DME, Decreased strength, Pain, Increased muscle spasms, Difficulty walking, Decreased mobility, Decreased balance, Decreased range of motion  Visit Diagnosis: Difficulty walking  Muscle weakness (generalized)  Localized edema  Stiffness of right knee, not elsewhere classified     Problem List Patient Active Problem List   Diagnosis Date Noted   Status post total right knee replacement 10/08/2020   Unilateral primary osteoarthritis, right knee 02/13/2018   Status post arthroscopy of right knee 02/13/2018   Old complex tear of lateral meniscus of right knee 08/08/2017   Chronic pain of right knee 03/05/2017    Farley Ly PT, MPT 12/31/2020, 12:35 PM  [facility] [address] [place], [place], [address] Phone: [phone]   Fax:  [phone]  Name: LAKRISHA ISEMAN MRN: [id number] Date of Birth: 1955/12/22

## 2020-12-31 NOTE — Patient Instructions (Signed)
Access Code: [email or web address] URL: [email or web address] Date: 12/31/2020 Prepared by: Vista Mink  Exercises Supine Quad Set - 5-6 x daily - 7 x weekly - 2-3 sets - 10 reps - 5 sec hold Supine Heel Slide with Strap - 5-6 x daily - 7 x weekly - 2-3 sets - 10 reps Seated Knee Flexion AAROM - 5-6 x daily - 7 x weekly - 2-3 sets - 10 reps - 10 sec hold Seated Long Arc Quad - 5-6 x daily - 7 x weekly - 2-3 sets - 10 reps - 5 sec hold Supine Quadricep Sets - 3-5 x daily - 7 x weekly - 2-3 sets - 10 reps - 5 second hold

## 2021-01-04 ENCOUNTER — Encounter: Payer: Medicare Other | Admitting: Physical Therapy

## 2021-01-07 ENCOUNTER — Ambulatory Visit (INDEPENDENT_AMBULATORY_CARE_PROVIDER_SITE_OTHER): Payer: Medicare Other | Admitting: Physical Therapy

## 2021-01-07 ENCOUNTER — Encounter: Payer: Self-pay | Admitting: Physical Therapy

## 2021-01-07 ENCOUNTER — Other Ambulatory Visit: Payer: Self-pay

## 2021-01-07 DIAGNOSIS — M25661 Stiffness of right knee, not elsewhere classified: Secondary | ICD-10-CM | POA: Diagnosis not present

## 2021-01-07 DIAGNOSIS — R262 Difficulty in walking, not elsewhere classified: Secondary | ICD-10-CM | POA: Diagnosis not present

## 2021-01-07 DIAGNOSIS — M6281 Muscle weakness (generalized): Secondary | ICD-10-CM

## 2021-01-07 DIAGNOSIS — R2689 Other abnormalities of gait and mobility: Secondary | ICD-10-CM

## 2021-01-07 DIAGNOSIS — M25561 Pain in right knee: Secondary | ICD-10-CM

## 2021-01-07 DIAGNOSIS — R600 Localized edema: Secondary | ICD-10-CM | POA: Diagnosis not present

## 2021-01-07 NOTE — Therapy (Signed)
[facility] Therapy [address] [place], [place], [address] Phone: [phone]   Fax:  [phone]  Physical Therapy Treatment  Patient Details  Name: Nancy Lee MRN: [id number] Date of Birth: [birth year] Referring Provider (PT): Mcarthur Rossetti, MD   Encounter Date: 01/07/2021   PT End of Session - 01/07/21 0924     Visit Number 21    Number of Visits 24    Date for PT Re-Evaluation 01/14/21    Progress Note Due on Visit 24    PT Start Time 0840    PT Stop Time 0923    PT Time Calculation (min) 43 min    Activity Tolerance Patient tolerated treatment well;Patient limited by fatigue    Behavior During Therapy [facility] for tasks assessed/performed             Past Medical History:  Diagnosis Date   Allergy    Arthritis    GERD (gastroesophageal reflux disease)    History of kidney stones    Hyperlipidemia    Hypertension    Hypothyroidism     Past Surgical History:  Procedure Laterality Date   ABDOMINAL HYSTERECTOMY  2000   fibroids   BUNIONECTOMY     CHOLECYSTECTOMY  2007   gallstones   DILATION AND CURETTAGE OF UTERUS     DILATION AND CURETTE     THYROIDECTOMY  05/02/2011   Procedure: THYROIDECTOMY;  Surgeon: Joyice Faster. Cornett, MD;  Location: [place];  Service: General;  Laterality: Bilateral;   TONSILLECTOMY     as a child   TONSILLECTOMY AND ADENOIDECTOMY     TOTAL KNEE ARTHROPLASTY Right 10/08/2020   Procedure: RIGHT TOTAL KNEE ARTHROPLASTY;  Surgeon: Mcarthur Rossetti, MD;  Location: [facility];  Service: Orthopedics;  Laterality: Right;  Needs RNFA   TOTAL THYROIDECTOMY  05/02/2011    There were no vitals filed for this visit.   Subjective Assessment - 01/07/21 0844     Subjective knee is stiff today    Limitations Sitting;Standing;Walking    How long can you sit comfortably? 30-60 minutes before it starts to stiffen up    How long can you stand comfortably? 30-60 minutes (was 15 minutes)    How long can  you walk comfortably? 25 minutes (was 10-15 minutes)    Patient Stated Goals improve pain and mobility, sleep better    Currently in Pain? No/denies    Pain Onset More than a month ago                               [facility] Adult PT Treatment/Exercise - 01/07/21 0844       Knee/Hip Exercises: Aerobic   Nustep L8 x 8 min      Knee/Hip Exercises: Machines for Strengthening   Total Gym Leg Press 100# 2 sets of 15 full extension to stretch at flexion (slow eccentrics); RLE only 50# 2x15      Knee/Hip Exercises: Standing   Forward Step Up Right;2 sets;10 reps;Hand Hold: 1;Step Height: 6"      Knee/Hip Exercises: Seated   Long Arc Quad Strengthening;Right;3 sets;10 reps    Long Arc Quad Weight 5 lbs.    Long [employer] Limitations 10 sec hold    Other Seated Knee/Hip Exercises seated SLR 2x10 reps; quad lag present                      PT Short Term  Goals - 12/22/20 0854       PT SHORT TERM GOAL #1   Title Independent with initial HEP    Status Achieved      PT SHORT TERM GOAL #2   Status Achieved               PT Long Term Goals - 12/31/20 1059       PT LONG TERM GOAL #1   Title Independent with final HEP    Status On-going      PT LONG TERM GOAL #2   Title Rt knee AROM improved 0-110 for improved function    Baseline -2 to 108 on 12/31/2020    Status On-going      PT LONG TERM GOAL #3   Title Amb independently without significant deviations for improved function    Baseline Uses cane outside the home    Status On-going      PT LONG TERM GOAL #4   Title Report pain < 4/10 for improved function    Status Achieved      PT LONG TERM GOAL #5   Title FOTO score improved to 66 for improved function    Status On-going                   Plan - 01/07/21 0924     Clinical Impression Statement Pt tolerated session well today with continued work on strengthening and balance.  Will work to transition to [employer] and community  fitness next week.    Personal Factors and Comorbidities Comorbidity 1    Comorbidities HTN    Examination-Activity Limitations Bed Mobility;Sleep;Squat;Stairs;Stand;Dressing;Hygiene/Grooming;Transfers;Locomotion Level    Examination-Participation Restrictions Community Activity;Driving    Rehab Potential Good    PT Frequency 2x / week    PT Duration 8 weeks    PT Treatment/Interventions ADLs/Self Care Home Management;Cryotherapy;Electrical Stimulation;Moist Heat;Balance training;Therapeutic exercise;Therapeutic activities;Functional mobility training;Stair training;Gait training;DME Instruction;Neuromuscular re-education;Patient/family education;Manual techniques;Vasopneumatic Device;Taping;Dry needling;Passive range of motion    PT Next Visit Plan finalize HEP, prepare for d/c    PT Home Exercise Plan Access Code: [email or web address]    Consulted and Agree with Plan of Care Patient             Patient will benefit from skilled therapeutic intervention in order to improve the following deficits and impairments:  Abnormal gait, Increased edema, Decreased knowledge of use of DME, Decreased strength, Pain, Increased muscle spasms, Difficulty walking, Decreased mobility, Decreased balance, Decreased range of motion  Visit Diagnosis: Difficulty walking  Muscle weakness (generalized)  Localized edema  Stiffness of right knee, not elsewhere classified  Other abnormalities of gait and mobility  Acute pain of right knee     Problem List Patient Active Problem List   Diagnosis Date Noted   Status post total right knee replacement 10/08/2020   Unilateral primary osteoarthritis, right knee 02/13/2018   Status post arthroscopy of right knee 02/13/2018   Old complex tear of lateral meniscus of right knee 08/08/2017   Chronic pain of right knee 03/05/2017        [name], PT, DPT 01/07/21 9:26 AM     [facility] Physical Therapy [address] [place], [place], [address] Phone: [phone]   Fax:  [phone]  Name: Nancy Lee MRN: [id number] Date of Birth: 1955-06-23

## 2021-01-10 ENCOUNTER — Telehealth: Payer: Self-pay | Admitting: Orthopaedic Surgery

## 2021-01-10 NOTE — Telephone Encounter (Signed)
Faxed to provided number  

## 2021-01-10 NOTE — Telephone Encounter (Signed)
Note written

## 2021-01-10 NOTE — Telephone Encounter (Signed)
Pt's dental office asking for a note from the provider or nurse stating that she does not need any antibiotics prior to her appt on Thursday for a routine cleaning. Pt is coming for P.T on Wednesday and asked if the letter can be ready by her appt on Wednesday. The best call back number is [phone].

## 2021-01-10 NOTE — Telephone Encounter (Signed)
Pt called and states Caryl Pina said she would fax the note for her. The fax number is [phone] Dr.morrison.   CB [phone]

## 2021-01-12 ENCOUNTER — Other Ambulatory Visit: Payer: Self-pay

## 2021-01-12 ENCOUNTER — Encounter: Payer: Self-pay | Admitting: Physical Therapy

## 2021-01-12 ENCOUNTER — Ambulatory Visit (INDEPENDENT_AMBULATORY_CARE_PROVIDER_SITE_OTHER): Payer: Medicare Other | Admitting: Physical Therapy

## 2021-01-12 DIAGNOSIS — M6281 Muscle weakness (generalized): Secondary | ICD-10-CM

## 2021-01-12 DIAGNOSIS — R262 Difficulty in walking, not elsewhere classified: Secondary | ICD-10-CM

## 2021-01-12 DIAGNOSIS — R600 Localized edema: Secondary | ICD-10-CM

## 2021-01-12 DIAGNOSIS — R2689 Other abnormalities of gait and mobility: Secondary | ICD-10-CM

## 2021-01-12 DIAGNOSIS — M25561 Pain in right knee: Secondary | ICD-10-CM

## 2021-01-12 DIAGNOSIS — M25661 Stiffness of right knee, not elsewhere classified: Secondary | ICD-10-CM | POA: Diagnosis not present

## 2021-01-12 NOTE — Patient Instructions (Signed)
Access Code: [email or web address] URL: [email or web address] Date: 01/12/2021 Prepared by: Faustino Congress  Exercises Supine Heel Slide with Strap - 5-6 x daily - 7 x weekly - 2-3 sets - 10 reps Seated Knee Flexion AAROM - 5-6 x daily - 7 x weekly - 2-3 sets - 10 reps - 10 sec hold Supine Quadricep Sets - 3-5 x daily - 7 x weekly - 2-3 sets - 10 reps - 5 second hold Seated Long Arc Quad with Ankle Weight - 1 x daily - 7 x weekly - 3 sets - 10 reps - 3-5 sec hold Sit to Stand - 2 x daily - 7 x weekly - 2-3 sets - 10 reps Mini Squat with Counter Support - 1 x daily - 7 x weekly - 3 sets - 10 reps Forward Step Up with Counter Support - 1 x daily - 7 x weekly - 3 sets - 10 reps Kettlebell Deadlift - 1 x daily - 7 x weekly - 3 sets - 10 reps

## 2021-01-12 NOTE — Therapy (Signed)
[facility] Physical Therapy [address] [place], [place], [address] Phone: [phone]   Fax:  [phone]  Physical Therapy Treatment  Patient Details  Name: Nancy Lee MRN: [id number] Date of Birth: Jul 09, 1955 Referring Provider (PT): Mcarthur Rossetti, MD   Encounter Date: 01/12/2021   PT End of Session - 01/12/21 1054     Visit Number 22    Number of Visits 24    Date for PT Re-Evaluation 01/14/21    Progress Note Due on Visit 24    PT Start Time 1014    PT Stop Time 1053    PT Time Calculation (min) 39 min    Activity Tolerance Patient tolerated treatment well;Patient limited by fatigue    Behavior During Therapy [facility] for tasks assessed/performed             Past Medical History:  Diagnosis Date   Allergy    Arthritis    GERD (gastroesophageal reflux disease)    History of kidney stones    Hyperlipidemia    Hypertension    Hypothyroidism     Past Surgical History:  Procedure Laterality Date   ABDOMINAL HYSTERECTOMY  2000   fibroids   BUNIONECTOMY     CHOLECYSTECTOMY  2007   gallstones   DILATION AND CURETTAGE OF UTERUS     DILATION AND CURETTE     THYROIDECTOMY  05/02/2011   Procedure: THYROIDECTOMY;  Surgeon: Joyice Faster. Cornett, MD;  Location: [place];  Service: General;  Laterality: Bilateral;   TONSILLECTOMY     as a child   TONSILLECTOMY AND ADENOIDECTOMY     TOTAL KNEE ARTHROPLASTY Right 10/08/2020   Procedure: RIGHT TOTAL KNEE ARTHROPLASTY;  Surgeon: Mcarthur Rossetti, MD;  [facility];  Service: Orthopedics;  Laterality: Right;  Needs RNFA   TOTAL THYROIDECTOMY  05/02/2011    There were no vitals filed for this visit.   Subjective Assessment - 01/12/21 1015     Subjective feels like knee is continuing to get better; still with some expected stiffness    Limitations Sitting;Standing;Walking    How long can you sit comfortably? 30-60 minutes before it starts to stiffen up    How long can you stand  comfortably? 30-60 minutes (was 15 minutes)    How long can you walk comfortably? 25 minutes (was 10-15 minutes)    Patient Stated Goals improve pain and mobility, sleep better    Currently in Pain? No/denies                               [facility] Adult PT Treatment/Exercise - 01/12/21 1016       Knee/Hip Exercises: Aerobic   Nustep L8 x 8 min      Knee/Hip Exercises: Machines for Strengthening   Total Gym Leg Press bil push 3x10 100#;      Knee/Hip Exercises: Standing   Functional Squat 3 sets;10 reps    Other Standing Knee Exercises RDL 10# 3x10      Knee/Hip Exercises: Seated   Long Arc Quad Strengthening;Right;3 sets;10 reps    Long Arc Quad Weight 5 lbs.    Other Seated Knee/Hip Exercises seated SLR 3x10 reps; quad lag present    Sit to Sand 10 reps;without UE support;2 sets                    PT Education - 01/12/21 1053     Education Details updated HEP  Person(s) Educated Patient    Methods Explanation;Demonstration;Handout    Comprehension Verbalized understanding;Returned demonstration              PT Short Term Goals - 01/12/21 1054       PT SHORT TERM GOAL #1   Title Independent with initial HEP    Status Achieved      PT SHORT TERM GOAL #2   Title Rt knee AROM improved 0-90 for improved function    Status Achieved               PT Long Term Goals - 12/31/20 1059       PT LONG TERM GOAL #1   Title Independent with final HEP    Status On-going      PT LONG TERM GOAL #2   Title Rt knee AROM improved 0-110 for improved function    Baseline -2 to 108 on 12/31/2020    Status On-going      PT LONG TERM GOAL #3   Title Amb independently without significant deviations for improved function    Baseline Uses cane outside the home    Status On-going      PT LONG TERM GOAL #4   Title Report pain < 4/10 for improved function    Status Achieved      PT LONG TERM GOAL #5   Title FOTO score improved to 66 for  improved function    Status On-going                   Plan - 01/12/21 1054     Clinical Impression Statement Session today focused on updating HEP in preparation for transition to home program.  Anticipate d/c next visit.    Personal Factors and Comorbidities Comorbidity 1    Comorbidities HTN    Examination-Activity Limitations Bed Mobility;Sleep;Squat;Stairs;Stand;Dressing;Hygiene/Grooming;Transfers;Locomotion Level    Examination-Participation Restrictions Community Activity;Driving    Rehab Potential Good    PT Frequency 2x / week    PT Duration 8 weeks    PT Treatment/Interventions ADLs/Self Care Home Management;Cryotherapy;Electrical Stimulation;Moist Heat;Balance training;Therapeutic exercise;Therapeutic activities;Functional mobility training;Stair training;Gait training;DME Instruction;Neuromuscular re-education;Patient/family education;Manual techniques;Vasopneumatic Device;Taping;Dry needling;Passive range of motion    PT Next Visit Plan check goals and d/c    PT Home Exercise Plan Access Code: [email or web address]    Consulted and Agree with Plan of Care Patient             Patient will benefit from skilled therapeutic intervention in order to improve the following deficits and impairments:  Abnormal gait, Increased edema, Decreased knowledge of use of DME, Decreased strength, Pain, Increased muscle spasms, Difficulty walking, Decreased mobility, Decreased balance, Decreased range of motion  Visit Diagnosis: Difficulty walking  Muscle weakness (generalized)  Localized edema  Stiffness of right knee, not elsewhere classified  Other abnormalities of gait and mobility  Acute pain of right knee     Problem List Patient Active Problem List   Diagnosis Date Noted   Status post total right knee replacement 10/08/2020   Unilateral primary osteoarthritis, right knee 02/13/2018   Status post arthroscopy of right knee 02/13/2018   Old complex tear of lateral meniscus  of right knee 08/08/2017   Chronic pain of right knee 03/05/2017      Nancy Lee, PT, DPT 01/12/21 10:59 AM     [facility] Physical Therapy [address] [place], [place], [address] Phone: [phone]   Fax:  [phone]  Name: Nancy Lee MRN: [id number] Date of  Birth: 1955/11/22

## 2021-01-14 ENCOUNTER — Encounter: Payer: Self-pay | Admitting: Physical Therapy

## 2021-01-14 ENCOUNTER — Other Ambulatory Visit: Payer: Self-pay

## 2021-01-14 ENCOUNTER — Ambulatory Visit (INDEPENDENT_AMBULATORY_CARE_PROVIDER_SITE_OTHER): Payer: Medicare Other | Admitting: Physical Therapy

## 2021-01-14 DIAGNOSIS — R600 Localized edema: Secondary | ICD-10-CM | POA: Diagnosis not present

## 2021-01-14 DIAGNOSIS — M25561 Pain in right knee: Secondary | ICD-10-CM

## 2021-01-14 DIAGNOSIS — R2689 Other abnormalities of gait and mobility: Secondary | ICD-10-CM

## 2021-01-14 DIAGNOSIS — R262 Difficulty in walking, not elsewhere classified: Secondary | ICD-10-CM | POA: Diagnosis not present

## 2021-01-14 DIAGNOSIS — M6281 Muscle weakness (generalized): Secondary | ICD-10-CM

## 2021-01-14 DIAGNOSIS — M25661 Stiffness of right knee, not elsewhere classified: Secondary | ICD-10-CM

## 2021-01-14 NOTE — Therapy (Signed)
[facility] Therapy [address] [place], [place], [address] Phone: [phone]   Fax:  [phone]  Physical Therapy Treatment/Discharge Summary  Patient Details  Name: RIELLE SCHLAUCH MRN: [id number] Date of Birth: 04-17-1956 Referring Provider (PT): Mcarthur Rossetti, MD   Encounter Date: 01/14/2021   PT End of Session - 01/14/21 1051     Visit Number 53    PT Start Time 1005    PT Stop Time 8676    PT Time Calculation (min) 38 min    Activity Tolerance Patient tolerated treatment well;Patient limited by fatigue    Behavior During Therapy [facility] for tasks assessed/performed             Past Medical History:  Diagnosis Date   Allergy    Arthritis    GERD (gastroesophageal reflux disease)    History of kidney stones    Hyperlipidemia    Hypertension    Hypothyroidism     Past Surgical History:  Procedure Laterality Date   ABDOMINAL HYSTERECTOMY  2000   fibroids   BUNIONECTOMY     CHOLECYSTECTOMY  2007   gallstones   DILATION AND CURETTAGE OF UTERUS     DILATION AND CURETTE     THYROIDECTOMY  05/02/2011   Procedure: THYROIDECTOMY;  Surgeon: Joyice Faster. Cornett, MD;  Location: [place];  Service: General;  Laterality: Bilateral;   TONSILLECTOMY     as a child   TONSILLECTOMY AND ADENOIDECTOMY     TOTAL KNEE ARTHROPLASTY Right 10/08/2020   Procedure: RIGHT TOTAL KNEE ARTHROPLASTY;  Surgeon: Mcarthur Rossetti, MD;  [facility];  Service: Orthopedics;  Laterality: Right;  Needs RNFA   TOTAL THYROIDECTOMY  05/02/2011    There were no vitals filed for this visit.   Subjective Assessment - 01/14/21 1009     Subjective walking without cane today, ready to d/c today.    Limitations Sitting;Standing;Walking    How long can you sit comfortably? 30-60 minutes before it starts to stiffen up    How long can you stand comfortably? 30-60 minutes (was 15 minutes)    How long can you walk comfortably? 25 minutes (was 10-15 minutes)     Patient Stated Goals improve pain and mobility, sleep better    Currently in Pain? No/denies                [facility] PT Assessment - 01/14/21 1018       Assessment   Medical Diagnosis M17.11 (ICD-10-CM) - Unilateral primary osteoarthritis, right knee    Referring Provider (PT) Mcarthur Rossetti, MD    Onset Date/Surgical Date 10/08/20      Observation/Other Assessments   Focus on Therapeutic Outcomes (FOTO)  84      AROM   Right Knee Extension 0    Right Knee Flexion 120                           [facility] Adult PT Treatment/Exercise - 01/14/21 1010       Knee/Hip Exercises: Aerobic   Recumbent Bike Seat 6 for 8 minutes Level 3-4      Knee/Hip Exercises: Standing   Forward Step Up Right;2 sets;10 reps;Hand Hold: 1;Step Height: 6"    Forward Step Up Limitations step up and over - UE support needed    Functional Squat 10 reps;2 sets    Other Standing Knee Exercises RDL 10# 2x10      Knee/Hip Exercises: Seated  Long Arc [employer] Strengthening;Right;10 reps;2 sets    [employer] Weight 5 lbs.    Sit to Sand 10 reps;without UE support      Knee/Hip Exercises: Supine   Quad Sets Strengthening;Both;1 set;10 reps;Limitations    Quad Sets Limitations 5 sec hold                      PT Short Term Goals - 01/12/21 1054       PT SHORT TERM GOAL #1   Title Independent with initial HEP    Status Achieved      PT SHORT TERM GOAL #2   Title Rt knee AROM improved 0-90 for improved function    Status Achieved               PT Long Term Goals - 01/14/21 1052       PT LONG TERM GOAL #1   Title Independent with final HEP    Status Achieved      PT LONG TERM GOAL #2   Title Rt knee AROM improved 0-110 for improved function    Baseline -2 to 108 on 12/31/2020    Status Achieved      PT LONG TERM GOAL #3   Title Amb independently without significant deviations for improved function    Baseline Uses cane outside the home    Status  Achieved      PT LONG TERM GOAL #4   Title Report pain < 4/10 for improved function    Status Achieved      PT LONG TERM GOAL #5   Title FOTO score improved to 66 for improved function    Status Achieved                   Plan - 01/14/21 1052     Clinical Impression Statement Pt has met all goals and is ready for d/c at this time. She still has some difficulty activating quad with TKE which will hopefully improve with time and continuation of HEP.  Will d/c PT today.    Personal Factors and Comorbidities Comorbidity 1    Comorbidities HTN    Examination-Activity Limitations Bed Mobility;Sleep;Squat;Stairs;Stand;Dressing;Hygiene/Grooming;Transfers;Locomotion Level    Examination-Participation Restrictions Community Activity;Driving    Rehab Potential Good    PT Frequency 2x / week    PT Duration 8 weeks    PT Treatment/Interventions ADLs/Self Care Home Management;Cryotherapy;Electrical Stimulation;Moist Heat;Balance training;Therapeutic exercise;Therapeutic activities;Functional mobility training;Stair training;Gait training;DME Instruction;Neuromuscular re-education;Patient/family education;Manual techniques;Vasopneumatic Device;Taping;Dry needling;Passive range of motion    PT Next Visit Plan d/c PT today    PT Home Exercise Plan Access Code: [email or web address]    Consulted and Agree with Plan of Care Patient             Patient will benefit from skilled therapeutic intervention in order to improve the following deficits and impairments:  Abnormal gait, Increased edema, Decreased knowledge of use of DME, Decreased strength, Pain, Increased muscle spasms, Difficulty walking, Decreased mobility, Decreased balance, Decreased range of motion  Visit Diagnosis: Difficulty walking  Muscle weakness (generalized)  Localized edema  Stiffness of right knee, not elsewhere classified  Other abnormalities of gait and mobility  Acute pain of right knee     Problem List Patient  Active Problem List   Diagnosis Date Noted   Status post total right knee replacement 10/08/2020   Unilateral primary osteoarthritis, right knee 02/13/2018   Status post arthroscopy of right knee 02/13/2018   Old complex  tear of lateral meniscus of right knee 08/08/2017   Chronic pain of right knee 03/05/2017     [name], PT, DPT 01/14/21 10:54 AM     [facility] Physical Therapy [address] [place], [place], [address] Phone: [phone]   Fax:  [phone]  Name: JAQUAYA COYLE MRN: [id number] Date of Birth: [birth year]       PHYSICAL THERAPY DISCHARGE SUMMARY  Visits from Start of Care: 23  Current functional level related to goals / functional outcomes: See above   Remaining deficits: Still has difficulty with activating quad for TKE   Education / Equipment: HEP   Patient agrees to discharge. Patient goals were met. Patient is being discharged due to meeting the stated rehab goals.   [name], PT, DPT 01/14/21 10:55 AM

## 2021-02-10 ENCOUNTER — Ambulatory Visit: Payer: Medicare Other

## 2021-02-11 ENCOUNTER — Other Ambulatory Visit: Payer: Self-pay

## 2021-02-11 ENCOUNTER — Ambulatory Visit
Admission: RE | Admit: 2021-02-11 | Discharge: 2021-02-11 | Disposition: A | Discharge status: Home / Self Care | Payer: Medicare Other | Source: Ambulatory Visit | Attending: Family Medicine | Admitting: Family Medicine

## 2021-02-11 DIAGNOSIS — Z1231 Encounter for screening mammogram for malignant neoplasm of breast: Secondary | ICD-10-CM

## 2021-04-11 ENCOUNTER — Encounter: Payer: Self-pay | Admitting: Orthopaedic Surgery

## 2021-04-11 ENCOUNTER — Ambulatory Visit (INDEPENDENT_AMBULATORY_CARE_PROVIDER_SITE_OTHER): Payer: Medicare Other | Admitting: Orthopaedic Surgery

## 2021-04-11 ENCOUNTER — Ambulatory Visit: Payer: Self-pay

## 2021-04-11 DIAGNOSIS — Z96651 Presence of right artificial knee joint: Secondary | ICD-10-CM

## 2021-04-11 NOTE — Progress Notes (Signed)
The patient is now 6 months status post a right total knee arthroplasty.  She is 65 years old and very active.  She says the knee is doing well.  She has been working on range of motion and strength.  It does get stiff from time to time but she pushes through things.  She does have press-fit implants.  Her incisions healed nicely with the right knee.  There is minimal swelling.  Her flexion and extension are full and the knee feels ligamentously stable.  2 views of the right knee show well-seated total knee arthroplasty in anatomic alignment with no evidence of loosening or complicating features.  It does look good for a press-fit implant.  At this point she is she is doing so well follow-up can be as needed.  If she does develop any issues with that right knee she will let me know.  She does report a remote history of some issues with her left shoulder and in fact he is bothering her she can come back and see [place] for the left shoulder.  All questions and concerns were answered and addressed.

## 2021-07-22 ENCOUNTER — Other Ambulatory Visit: Payer: Self-pay | Admitting: Family Medicine

## 2021-07-22 DIAGNOSIS — Z1231 Encounter for screening mammogram for malignant neoplasm of breast: Secondary | ICD-10-CM

## 2022-02-13 ENCOUNTER — Ambulatory Visit
Admission: RE | Admit: 2022-02-13 | Discharge: 2022-02-13 | Disposition: A | Discharge status: Home / Self Care | Payer: Medicare Other | Source: Ambulatory Visit | Attending: Family Medicine | Admitting: Family Medicine

## 2022-02-13 ENCOUNTER — Ambulatory Visit: Payer: Medicare Other

## 2022-02-13 DIAGNOSIS — Z1231 Encounter for screening mammogram for malignant neoplasm of breast: Secondary | ICD-10-CM

## 2022-08-07 ENCOUNTER — Other Ambulatory Visit: Payer: Self-pay | Admitting: Internal Medicine

## 2022-08-07 DIAGNOSIS — Z1231 Encounter for screening mammogram for malignant neoplasm of breast: Secondary | ICD-10-CM

## 2023-02-15 ENCOUNTER — Ambulatory Visit: Payer: Medicare Other

## 2023-02-15 ENCOUNTER — Ambulatory Visit
Admission: RE | Admit: 2023-02-15 | Discharge: 2023-02-15 | Disposition: A | Discharge status: Home / Self Care | Payer: Medicare Other | Source: Ambulatory Visit | Attending: Internal Medicine | Admitting: Internal Medicine

## 2023-02-15 DIAGNOSIS — Z1231 Encounter for screening mammogram for malignant neoplasm of breast: Secondary | ICD-10-CM

## 2023-05-09 ENCOUNTER — Other Ambulatory Visit (INDEPENDENT_AMBULATORY_CARE_PROVIDER_SITE_OTHER): Payer: Medicare Other

## 2023-05-09 ENCOUNTER — Encounter: Payer: Self-pay | Admitting: Orthopaedic Surgery

## 2023-05-09 ENCOUNTER — Ambulatory Visit: Payer: Medicare Other | Admitting: Orthopaedic Surgery

## 2023-05-09 VITALS — Ht 63.39 in | Wt 165.0 lb

## 2023-05-09 DIAGNOSIS — G8929 Other chronic pain: Secondary | ICD-10-CM

## 2023-05-09 DIAGNOSIS — M25512 Pain in left shoulder: Secondary | ICD-10-CM

## 2023-05-09 DIAGNOSIS — M25562 Pain in left knee: Secondary | ICD-10-CM

## 2023-05-09 MED ORDER — METHYLPREDNISOLONE ACETATE 40 MG/ML IJ SUSP
40.0000 mg | INTRAMUSCULAR | Status: AC | PRN
  Administered 2023-05-09: 40 mg via INTRA_ARTICULAR

## 2023-05-09 MED ORDER — LIDOCAINE HCL 1 % IJ SOLN
3.0000 mL | INTRAMUSCULAR | Status: AC | PRN
  Administered 2023-05-09: 3 mL

## 2023-05-09 NOTE — Progress Notes (Signed)
The patient is very well-known to [place].  She is 67 years old and very active.  We replaced her right knee back in 2022 and that is done very well.  She has been having left knee pain since the summer when she twisted that knee.  She says it is more of a deep and aching pain in that knee does get stiff.  She is also been dealing with chronic left shoulder pain after a fall back in 2008.  She said the pain comes and goes and is more deep in the joint.  She denies any numbness and tingling in her left upper extremity and denies any neck pain.  On exam her right total knee replacement moves smoothly and fluidly with no swelling.  That knee is stable.  Her left knee shows no effusion with full range of motion and is ligamentously stable with minimal pain.  Examination of her left shoulder shows actually excellent range of motion of the shoulder and excellent strength.  The rotator cuff seems to be intact as well.  X-rays of the left knee show well-maintained joint space with no acute findings.  X-rays of the left shoulder do show moderate glenohumeral arthritis with a posterior osteophyte off of the humeral head and joint space narrowing.  Since her pain is not severe, I did recommend a steroid injection in her left shoulder and her left knee.  She is not a diabetic.  She also wishes to try these injections as well as a hinged knee brace for her left knee.  She did tolerate the steroid injections well.  I told her if things are not getting better or worsen do not hesitate to come back and see [place].  Otherwise, follow-up is as needed.    Procedure Note  Patient: Nancy Lee             Date of Birth: [birth year]           MRN: [id number]             Visit Date: 05/09/2023  Procedures: Visit Diagnoses:  1. Chronic pain of left knee   2. Chronic left shoulder pain     Large Joint Inj: L knee on 05/09/2023 3:01 PM Indications: diagnostic evaluation and pain Details: 22 G 1.5 in needle, superolateral  approach  Arthrogram: No  Medications: 3 mL lidocaine 1 %; 40 mg methylPREDNISolone acetate 40 MG/ML Outcome: tolerated well, no immediate complications Procedure, treatment alternatives, risks and benefits explained, specific risks discussed. Consent was given by the patient. Immediately prior to procedure a time out was called to verify the correct patient, procedure, equipment, support staff and site/side marked as required. Patient was prepped and draped in the usual sterile fashion.    Large Joint Inj: L subacromial bursa on 05/09/2023 3:01 PM Indications: pain and diagnostic evaluation Details: 22 G 1.5 in needle  Arthrogram: No  Medications: 3 mL lidocaine 1 %; 40 mg methylPREDNISolone acetate 40 MG/ML Outcome: tolerated well, no immediate complications Procedure, treatment alternatives, risks and benefits explained, specific risks discussed. Consent was given by the patient. Immediately prior to procedure a time out was called to verify the correct patient, procedure, equipment, support staff and site/side marked as required. Patient was prepped and draped in the usual sterile fashion.

## 2023-05-10 ENCOUNTER — Encounter: Payer: Self-pay | Admitting: Internal Medicine

## 2023-05-10 ENCOUNTER — Other Ambulatory Visit: Payer: Self-pay | Admitting: Internal Medicine

## 2023-05-10 DIAGNOSIS — Z13820 Encounter for screening for osteoporosis: Secondary | ICD-10-CM

## 2023-07-16 ENCOUNTER — Other Ambulatory Visit: Payer: Self-pay | Admitting: Gastroenterology

## 2023-07-16 DIAGNOSIS — R1319 Other dysphagia: Secondary | ICD-10-CM

## 2023-07-16 DIAGNOSIS — R1311 Dysphagia, oral phase: Secondary | ICD-10-CM

## 2023-07-31 ENCOUNTER — Other Ambulatory Visit: Payer: Self-pay | Admitting: Internal Medicine

## 2023-07-31 DIAGNOSIS — Z1231 Encounter for screening mammogram for malignant neoplasm of breast: Secondary | ICD-10-CM

## 2023-08-02 ENCOUNTER — Ambulatory Visit
Admission: RE | Admit: 2023-08-02 | Discharge: 2023-08-02 | Disposition: A | Discharge status: Home / Self Care | Payer: Medicare Other | Source: Ambulatory Visit | Attending: Gastroenterology | Admitting: Gastroenterology

## 2023-08-02 DIAGNOSIS — R1311 Dysphagia, oral phase: Secondary | ICD-10-CM

## 2023-08-02 DIAGNOSIS — R1319 Other dysphagia: Secondary | ICD-10-CM

## 2023-11-01 ENCOUNTER — Encounter: Payer: Self-pay | Admitting: Podiatry

## 2023-11-01 ENCOUNTER — Ambulatory Visit (INDEPENDENT_AMBULATORY_CARE_PROVIDER_SITE_OTHER): Admitting: Podiatry

## 2023-11-01 ENCOUNTER — Ambulatory Visit (INDEPENDENT_AMBULATORY_CARE_PROVIDER_SITE_OTHER)

## 2023-11-01 DIAGNOSIS — M7750 Other enthesopathy of unspecified foot: Secondary | ICD-10-CM | POA: Diagnosis not present

## 2023-11-01 DIAGNOSIS — M7751 Other enthesopathy of right foot: Secondary | ICD-10-CM

## 2023-11-01 DIAGNOSIS — M7752 Other enthesopathy of left foot: Secondary | ICD-10-CM

## 2023-11-05 NOTE — Progress Notes (Signed)
 Subjective:   Patient ID: Nancy Lee, female   DOB: 68 y.o.   MRN: [id number]   HPI Patient presents concerned about swelling in her ankles of both feet and states while it is not always tender at times it can be and she is concerned if it could be related to veins.  Patient does not smoke and does not like to be active   Review of Systems  All other systems reviewed and are negative.       Objective:  Physical Exam Vitals and nursing note reviewed.  Constitutional:      Appearance: She is well-developed.  Pulmonary:     Effort: Pulmonary effort is normal.  Musculoskeletal:        General: Normal range of motion.  Skin:    General: Skin is warm.  Neurological:     Mental Status: She is alert.     Neurovascular status intact muscle strength was found to be adequate range of motion adequate with patient noted to have mild edema in the ankle region bilateral mostly on the lateral side not pitting with negative Celine Collard' sign noted bilateral     Assessment:  Normal amount of edema in the ankles does not appear to be pathological currently and is not causing her pain 8     Plan:  NP reviewed discussed compression and she will use compression as needed and also elevation as needed.  Do not recommend more aggressive treatment may be necessary someday if it gets worse  X-rays indicate that there is no signs of arthritis associated with the swelling or indications of fracture or other acute process

## 2024-01-02 ENCOUNTER — Other Ambulatory Visit: Payer: Medicare Other

## 2024-01-09 ENCOUNTER — Other Ambulatory Visit (HOSPITAL_BASED_OUTPATIENT_CLINIC_OR_DEPARTMENT_OTHER)

## 2024-02-01 ENCOUNTER — Ambulatory Visit (HOSPITAL_BASED_OUTPATIENT_CLINIC_OR_DEPARTMENT_OTHER)
Admission: RE | Admit: 2024-02-01 | Discharge: 2024-02-01 | Disposition: A | Discharge status: Home / Self Care | Source: Ambulatory Visit | Attending: Internal Medicine | Admitting: Internal Medicine

## 2024-02-01 DIAGNOSIS — M8589 Other specified disorders of bone density and structure, multiple sites: Secondary | ICD-10-CM | POA: Diagnosis not present

## 2024-02-01 DIAGNOSIS — Z780 Asymptomatic menopausal state: Secondary | ICD-10-CM | POA: Diagnosis not present

## 2024-02-01 DIAGNOSIS — Z13820 Encounter for screening for osteoporosis: Secondary | ICD-10-CM | POA: Diagnosis present

## 2024-02-19 ENCOUNTER — Ambulatory Visit: Payer: Medicare Other

## 2024-02-20 ENCOUNTER — Ambulatory Visit
Admission: RE | Admit: 2024-02-20 | Discharge: 2024-02-20 | Disposition: A | Discharge status: Home / Self Care | Source: Ambulatory Visit | Attending: Internal Medicine

## 2024-02-20 DIAGNOSIS — Z1231 Encounter for screening mammogram for malignant neoplasm of breast: Secondary | ICD-10-CM

## 2024-04-14 ENCOUNTER — Ambulatory Visit (INDEPENDENT_AMBULATORY_CARE_PROVIDER_SITE_OTHER): Admitting: Orthopaedic Surgery

## 2024-04-14 ENCOUNTER — Encounter: Payer: Self-pay | Admitting: Orthopaedic Surgery

## 2024-04-14 DIAGNOSIS — M25512 Pain in left shoulder: Secondary | ICD-10-CM

## 2024-04-14 DIAGNOSIS — G8929 Other chronic pain: Secondary | ICD-10-CM | POA: Diagnosis not present

## 2024-04-14 MED ORDER — LIDOCAINE HCL 1 % IJ SOLN
3.0000 mL | INTRAMUSCULAR | Status: AC | PRN
  Administered 2024-04-14: 3 mL

## 2024-04-14 MED ORDER — METHYLPREDNISOLONE ACETATE 40 MG/ML IJ SUSP
40.0000 mg | INTRAMUSCULAR | Status: AC | PRN
  Administered 2024-04-14: 40 mg via INTRA_ARTICULAR

## 2024-04-14 NOTE — Progress Notes (Signed)
 The patient is an active 68 year old female who comes in today requesting a steroid injection in her left shoulder.  We actually injected her shoulder the last time about 11 months ago in November of last year secondary to impingement type symptoms.  She was working at a [employer] and doing a lot of heavy lifting and reaching overhead and her shoulders flared up recently.  She really would like to have a steroid injection today.  She denies any other acute changes in her medical status and only reports some pain with that shoulder with reaching behind and overhead activities.  She has not otherwise had any other injuries.  She is not a diabetic.  The left shoulder does move smoothly and fluidly.  There is no blocks or rotation.  She does show signs of impingement.  I did place a steroid injection in her left shoulder subacromial outlet without difficulty.  If she does develop any issues or is not getting better she knows to come back and let us  know.  All questions and concerns were addressed and answered.    Procedure Note  Patient: Nancy Lee             Date of Birth: [birth year]           MRN: [id number]             Visit Date: 04/14/2024  Procedures: Visit Diagnoses:  1. Chronic left shoulder pain     Large Joint Inj: L subacromial bursa on 04/14/2024 8:50 AM Indications: pain and diagnostic evaluation Details: 22 G 1.5 in needle  Arthrogram: No  Medications: 3 mL lidocaine  1 %; 40 mg methylPREDNISolone  acetate 40 MG/ML Outcome: tolerated well, no immediate complications Procedure, treatment alternatives, risks and benefits explained, specific risks discussed. Consent was given by the patient. Immediately prior to procedure a time out was called to verify the correct patient, procedure, equipment, support staff and site/side marked as required. Patient was prepped and draped in the usual sterile fashion.

## 2024-04-21 ENCOUNTER — Encounter: Payer: Self-pay | Admitting: Radiology

## 2024-05-07 ENCOUNTER — Ambulatory Visit: Admitting: Orthopaedic Surgery
